# Patient Record
Sex: Female | Born: 1980 | Race: Black or African American | Hispanic: No | Marital: Single | Smoking: Never smoker
Health system: Southern US, Community
[De-identification: ages and names within clinical notes are randomized; demographics above are authoritative.]

## PROBLEM LIST (undated history)

## (undated) DIAGNOSIS — G43909 Migraine, unspecified, not intractable, without status migrainosus: Secondary | ICD-10-CM

## (undated) HISTORY — PX: TUBAL LIGATION: SHX77

---

## 2002-07-21 ENCOUNTER — Emergency Department (HOSPITAL_COMMUNITY): Admission: EM | Admit: 2002-07-21 | Discharge: 2002-07-21 | Payer: Self-pay | Admitting: Emergency Medicine

## 2002-08-09 ENCOUNTER — Other Ambulatory Visit: Admission: RE | Admit: 2002-08-09 | Discharge: 2002-08-09 | Payer: Self-pay | Admitting: Obstetrics and Gynecology

## 2003-05-23 ENCOUNTER — Other Ambulatory Visit: Admission: RE | Admit: 2003-05-23 | Discharge: 2003-05-23 | Payer: Self-pay | Admitting: Obstetrics and Gynecology

## 2003-12-04 ENCOUNTER — Inpatient Hospital Stay: Admission: AD | Admit: 2003-12-04 | Discharge: 2003-12-04 | Payer: Self-pay | Admitting: Obstetrics and Gynecology

## 2003-12-05 ENCOUNTER — Inpatient Hospital Stay (HOSPITAL_COMMUNITY): Admission: AD | Admit: 2003-12-05 | Discharge: 2003-12-07 | Payer: Self-pay | Admitting: Obstetrics and Gynecology

## 2003-12-05 ENCOUNTER — Encounter (INDEPENDENT_AMBULATORY_CARE_PROVIDER_SITE_OTHER): Payer: Self-pay | Admitting: Specialist

## 2006-05-20 ENCOUNTER — Emergency Department (HOSPITAL_COMMUNITY): Admission: EM | Admit: 2006-05-20 | Discharge: 2006-05-20 | Payer: Self-pay | Admitting: Family Medicine

## 2006-06-14 ENCOUNTER — Emergency Department (HOSPITAL_COMMUNITY): Admission: EM | Admit: 2006-06-14 | Discharge: 2006-06-14 | Payer: Self-pay | Admitting: Emergency Medicine

## 2006-07-06 ENCOUNTER — Emergency Department (HOSPITAL_COMMUNITY): Admission: EM | Admit: 2006-07-06 | Discharge: 2006-07-06 | Payer: Self-pay | Admitting: *Deleted

## 2006-07-28 ENCOUNTER — Ambulatory Visit (HOSPITAL_COMMUNITY): Admission: RE | Admit: 2006-07-28 | Discharge: 2006-07-28 | Payer: Self-pay | Admitting: Obstetrics & Gynecology

## 2006-08-10 ENCOUNTER — Inpatient Hospital Stay (HOSPITAL_COMMUNITY): Admission: AD | Admit: 2006-08-10 | Discharge: 2006-08-10 | Payer: Self-pay | Admitting: Obstetrics

## 2006-08-24 ENCOUNTER — Emergency Department (HOSPITAL_COMMUNITY): Admission: EM | Admit: 2006-08-24 | Discharge: 2006-08-24 | Payer: Self-pay | Admitting: Emergency Medicine

## 2006-10-12 ENCOUNTER — Ambulatory Visit (HOSPITAL_COMMUNITY): Admission: RE | Admit: 2006-10-12 | Discharge: 2006-10-12 | Payer: Self-pay | Admitting: Obstetrics & Gynecology

## 2006-10-27 ENCOUNTER — Inpatient Hospital Stay (HOSPITAL_COMMUNITY): Admission: AD | Admit: 2006-10-27 | Discharge: 2006-10-27 | Payer: Self-pay | Admitting: Obstetrics & Gynecology

## 2006-12-25 ENCOUNTER — Inpatient Hospital Stay (HOSPITAL_COMMUNITY): Admission: AD | Admit: 2006-12-25 | Discharge: 2006-12-25 | Payer: Self-pay | Admitting: Obstetrics

## 2007-02-06 ENCOUNTER — Inpatient Hospital Stay (HOSPITAL_COMMUNITY): Admission: AD | Admit: 2007-02-06 | Discharge: 2007-02-06 | Payer: Self-pay | Admitting: Obstetrics & Gynecology

## 2007-02-14 ENCOUNTER — Inpatient Hospital Stay (HOSPITAL_COMMUNITY): Admission: AD | Admit: 2007-02-14 | Discharge: 2007-02-14 | Payer: Self-pay | Admitting: Obstetrics & Gynecology

## 2007-02-14 ENCOUNTER — Inpatient Hospital Stay (HOSPITAL_COMMUNITY): Admission: AD | Admit: 2007-02-14 | Discharge: 2007-02-16 | Payer: Self-pay | Admitting: Obstetrics

## 2007-02-14 ENCOUNTER — Inpatient Hospital Stay (HOSPITAL_COMMUNITY): Admission: AD | Admit: 2007-02-14 | Discharge: 2007-02-14 | Payer: Self-pay | Admitting: Obstetrics

## 2007-04-20 ENCOUNTER — Ambulatory Visit (HOSPITAL_COMMUNITY): Admission: RE | Admit: 2007-04-20 | Discharge: 2007-04-20 | Payer: Self-pay | Admitting: Obstetrics & Gynecology
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
## 2008-07-09 ENCOUNTER — Emergency Department (HOSPITAL_COMMUNITY): Admission: EM | Admit: 2008-07-09 | Discharge: 2008-07-09 | Payer: Self-pay | Admitting: Emergency Medicine

## 2008-07-13 ENCOUNTER — Emergency Department (HOSPITAL_COMMUNITY): Admission: EM | Admit: 2008-07-13 | Discharge: 2008-07-13 | Payer: Self-pay | Admitting: Emergency Medicine

## 2009-03-25 ENCOUNTER — Emergency Department (HOSPITAL_COMMUNITY): Admission: EM | Admit: 2009-03-25 | Discharge: 2009-03-25 | Payer: Self-pay | Admitting: Family Medicine

## 2009-08-18 ENCOUNTER — Emergency Department (HOSPITAL_COMMUNITY): Admission: EM | Admit: 2009-08-18 | Discharge: 2009-08-18 | Payer: Self-pay | Admitting: Family Medicine

## 2010-10-31 ENCOUNTER — Encounter: Payer: Self-pay | Admitting: Obstetrics & Gynecology

## 2011-02-22 NOTE — H&P (Signed)
NAME:  POETRY, CERRO NO.:  [id number]   MEDICAL RECORD NO.:  [id number]          PATIENT TYPE:  AMB   LOCATION:                                FACILITY:  [facility]   PHYSICIAN:  Roseanna Rainbow, M.D.DATE OF BIRTH:  1981-01-18   DATE OF ADMISSION:  DATE OF DISCHARGE:                              HISTORY & PHYSICAL   CHIEF COMPLAINT:  The patient is a 30 year old who desires a  sterilization and presents for a laparoscopic bilateral tubal ligation.   HISTORY OF PRESENT ILLNESS:  Please see the above.   PAST GYNECOLOGICAL HISTORY:  Chlamydia.   PAST MEDICAL HISTORY:  She denies.   PAST SURGICAL HISTORY:  No previous surgery.   SOCIAL HISTORY:  She works at [employer], single.  She does  not give any significant history of alcohol use.  She has no significant  smoking history.  Denies illicit drug use.   FAMILY HISTORY:  No major illnesses noted.   ALLERGIES:  No known drug allergies.   MEDICATIONS:  Please see the medication reconciliation form.   PAST OBSTETRICAL HISTORY:  She is status post 2 NSVDs.   PHYSICAL EXAMINATION:  VITAL SIGNS:  Temperature 97.7, pulse 65, blood  pressure 112/77, weight 112 pounds.  GENERAL:  Well-developed, well-nourished, in no apparent distress.  LUNGS:  Clear to auscultation bilaterally.  HEART:  Regular rate and rhythm.  ABDOMEN:  No organomegaly.  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  On speculum exam, the vagina is  clean.  The cervix is without lesions.  Bimanual exam:  The uterus is  small, anteverted and nontender.  The adnexa are nontender and no  organomegaly appreciated.   ASSESSMENT:  Multipara, desires a sterilization procedure.   PLAN:  The planned procedure is a laparoscopic bilateral tubal ligation  with fulguration.  The risks, benefits and alternatives forms of  management were reviewed with the patient including, but not limited to,  4-8 per 1000 risk of failure with a subsequent  increased risk of an  ectopic pregnancy.      Roseanna Rainbow, M.D.  Electronically Signed     LAJ/MEDQ  D:  04/20/2007  T:  04/20/2007  Job:  [id number]

## 2011-02-22 NOTE — Op Note (Signed)
NAME:  Grace Woods, KRING NO.:  [id number]   MEDICAL RECORD NO.:  [id number]          PATIENT TYPE:  AMB   LOCATION:  [facility]                           FACILITY:  [facility]   PHYSICIAN:  Roseanna Rainbow, M.D.DATE OF BIRTH:  1981-01-10   DATE OF PROCEDURE:  04/20/2007  DATE OF DISCHARGE:                               OPERATIVE REPORT   PREOPERATIVE DIAGNOSIS:  Multiparity, desires sterilization   POSTOPERATIVE DIAGNOSIS:  Multiparity, desires sterilization   PROCEDURE:  Laparoscopic bilateral tubal ligation with fulguration.   SURGEON:  Roseanna Rainbow, M.D.   ANESTHESIA:  General endotracheal anesthesia.   COMPLICATIONS:  None.   ESTIMATED BLOOD LOSS:  Minimal.   FINDINGS:  Involving the ampullary portion of the right fallopian tube  was a filmy adhesion to the right ovary.  This was unavoidable torn  during attempts to elevate the tube and follow out the tube to its  fimbriated end.  A small bleeding site on the ovary was cauterized with  the bipolar cautery.  The site of the ovary was irrigated and felt to be  hemostatic.   PROCEDURE IN DETAIL:  The patient was taken to the operating room with  an IV running.  She was given general anesthesia and prepped and draped  in the usual sterile fashion.  An infraumbilical skin incision was then  made with the scalpel.  The Veress needle was then introduced into the  peritoneal cavity while tenting up the anterior abdominal wall at a 45  degrees angle.  Intra-abdominal placement was confirmed by saline drop  test and the appropriate low pressure reading upon insufflating the  abdomen with CO2 gas.  The abdomen was insufflated with 4 liters of CO2  gas.  The Veress needle was then removed.  The trocar and sleeve were  then advanced into the abdomen where intra-abdominal placement was  confirmed with the laparoscope.  Please see the above description with  the findings.  The mid isthmic portion of the right  fallopian tube was  then cauterized contiguously.  Cauterization was carried out using the  bipolar instrument.  With each application, the ohmmeter was noted to go  to 0.  The left fallopian tube was manipulated in a similar fashion.  All the instruments were then removed from the abdomen.  The skin  incision was then reapproximated with interrupted sutures of 3-0 Vicryl.  Please note that prior to the laparoscopy, a Hulka manipulator had been  advanced into the uterus and secured to the anterior lip of the  cervix as a means to manipulate the uterus.  This was removed at the end  of the procedure with minimal bleeding noted from the cervix.  At the  close of the procedure, the instrument and pack counts were said to be  correct x2.  The patient was taken to the PACU awake and in stable  condition.      Roseanna Rainbow, M.D.  Electronically Signed     LAJ/MEDQ  D:  04/20/2007  T:  04/21/2007  Job:  [id number]

## 2011-02-25 NOTE — Consult Note (Signed)
NAME:  Grace Woods, Grace Woods                        ACCOUNT NO.:  [id number]   MEDICAL RECORD NO.:  [id number]                   PATIENT TYPE:  MAT   LOCATION:  [facility]                                 FACILITY:  [facility]   PHYSICIAN:  Lenoard Aden, M.D.             DATE OF BIRTH:  26-Oct-1980   DATE OF CONSULTATION:  DATE OF DISCHARGE:                                   CONSULTATION   CHIEF COMPLAINT:  Rule out labor.   HISTORY OF PRESENT ILLNESS:  The patient is a 30 year old African-American  female, G1, P0, EDD December 16, 2003, at 38+ weeks, who presents with irregular  contractions and questionable bloody show.   The patient has no known drug allergies.   MEDICATIONS:  Prenatal vitamins, noncontributory.   PAST OBSTETRICAL HISTORY:  Noncontributory.   PAST MEDICAL HISTORY:  Noncontributory except for history of UTI x1.   FAMILY HISTORY:  Breast and prostate cancer.   PRENATAL LABORATORY DATA:  Blood type O positive.  Rh antibody negative.  Rubella immune.  Hepatitis, HIV negative.  GC, Chlamydia negative.  Hemoglobin electrophoresis within normal limits.   Pregnancy course is otherwise uncomplicated except for poor maternal weight  gain.   PHYSICAL EXAMINATION:  GENERAL:  She is a well-developed, well-nourished  African-American female in no acute distress, flat affect noted.  HEENT:  Normal.  CHEST:  Lungs clear.  CARDIAC:  Regular rhythm.  ABDOMEN:  Soft, gravid, and nontender.  PELVIC:  Cervix flared but closed, 2.5 cm long, vertex, and ballottable.  EXTREMITIES:  No cords.  NEUROLOGIC:  Nonfocal.   Fetal heart rate tracing in the 150 beat per minute range, now with  accelerations up to the 160s, of about 10-15 minutes within the tracing,  which reveals decreased beat-to-beat variability and subtle late  decelerations, which has now resolved.  Previous tracing over the last three  to four hours reveals otherwise reassuring tracing but not reactive by strip  criteria.   IMPRESSION:  1. Intrauterine pregnancy, 38+ weeks.  2. Prodromal labor pattern.  3. Questionable nonreassuring fetal heart rate status with reassuring     elements noted at this time, unfavorable cervix noted.   PLAN:  Obtain a BPP at this time.  Pending BPP and continuous monitoring,  will make a decision regarding expectant management versus induction and  possible delivery.                                               Lenoard Aden, M.D.    RJT/MEDQ  D:  12/04/2003  T:  12/04/2003  Job:  [id number]

## 2011-03-18 ENCOUNTER — Other Ambulatory Visit: Payer: Self-pay | Admitting: Family Medicine

## 2011-07-26 LAB — CBC
HCT: 37.8
Hemoglobin: 12.7
MCHC: 33.5
MCV: 84.4
RBC: 4.48
WBC: 3.2 — ABNORMAL LOW

## 2011-11-24 ENCOUNTER — Other Ambulatory Visit: Payer: Self-pay | Admitting: Family Medicine

## 2011-11-24 DIAGNOSIS — N644 Mastodynia: Secondary | ICD-10-CM

## 2011-12-02 ENCOUNTER — Other Ambulatory Visit: Payer: Self-pay

## 2011-12-05 ENCOUNTER — Ambulatory Visit
Admission: RE | Admit: 2011-12-05 | Discharge: 2011-12-05 | Disposition: A | Discharge status: Home / Self Care | Payer: Medicaid Other | Source: Ambulatory Visit | Attending: Family Medicine | Admitting: Family Medicine

## 2011-12-05 DIAGNOSIS — N644 Mastodynia: Secondary | ICD-10-CM

## 2012-12-12 ENCOUNTER — Encounter (HOSPITAL_COMMUNITY): Payer: Self-pay | Admitting: Emergency Medicine

## 2012-12-12 ENCOUNTER — Emergency Department (HOSPITAL_COMMUNITY)
Admission: EM | Admit: 2012-12-12 | Discharge: 2012-12-12 | Disposition: A | Discharge status: Home / Self Care | Payer: Medicaid Other | Source: Home / Self Care | Attending: Emergency Medicine | Admitting: Emergency Medicine

## 2012-12-12 DIAGNOSIS — R55 Syncope and collapse: Secondary | ICD-10-CM

## 2012-12-12 DIAGNOSIS — N390 Urinary tract infection, site not specified: Secondary | ICD-10-CM

## 2012-12-12 DIAGNOSIS — R110 Nausea: Secondary | ICD-10-CM

## 2012-12-12 DIAGNOSIS — R42 Dizziness and giddiness: Secondary | ICD-10-CM

## 2012-12-12 LAB — POCT I-STAT, CHEM 8
BUN: 7 mg/dL (ref 6–23)
Calcium, Ion: 1.24 mmol/L — ABNORMAL HIGH (ref 1.12–1.23)
Creatinine, Ser: 0.9 mg/dL (ref 0.50–1.10)
Glucose, Bld: 88 mg/dL (ref 70–99)
Sodium: 142 mEq/L (ref 135–145)
TCO2: 29 mmol/L (ref 0–100)

## 2012-12-12 LAB — POCT URINALYSIS DIP (DEVICE)
Hgb urine dipstick: NEGATIVE
Protein, ur: 30 mg/dL — AB
Specific Gravity, Urine: 1.025 (ref 1.005–1.030)
Urobilinogen, UA: 4 mg/dL — ABNORMAL HIGH (ref 0.0–1.0)
pH: 6.5 (ref 5.0–8.0)

## 2012-12-12 MED ORDER — CEPHALEXIN 500 MG PO CAPS: 500.0000 mg | ORAL_CAPSULE | Freq: Three times a day (TID) | ORAL | Status: DC

## 2012-12-12 NOTE — ED Notes (Signed)
Patient complains of chest pain and dizziness.

## 2012-12-12 NOTE — ED Provider Notes (Signed)
Chief Complaint  Patient presents with  . Chest Pain    History of Present Illness:   Grace Woods is a 32 year old female who passed out this past Sunday, 4 days ago while at church. Prior to the episode she felt sweaty, dizzy, nauseated. The patient states she will was unconscious for 20 minutes. There was no seizure activity and she denies any associated chest pain, tightness, pressure, shortness of breath, or palpitations either before, during, or after the event. EMS was called, and upon their arrival her blood pressure was a little bit low (she does not know the exact reading) but otherwise her vital signs were stable. She did not go to the hospital on that day. She went home and ever since then she's felt dizzy, nauseated, shaky, and weak. She vomited a couple times. She's felt hot, and had a headache, blurry vision, lower abdominal pain, but no diplopia, fever, chills, sore throat, coughing, wheezing, or shortness of breath. She denies any diarrhea or evidence of GI bleeding. She's had no urinary symptoms. Her last menstrual period December 15. She does not think she is pregnant. She's otherwise in good health. She takes no medications.  Review of Systems:  Other than noted above, the patient denies any of the following symptoms. Systemic:  No fever, chills, or fatigue. Pulmonary:  No cough, wheezing, shortness of breath. Cardiac:  No chest pain, tightness, pressure, palpitations, PND, orthopnea, or edema. Ext:  No leg pain or swelling. Neuro:  No weakness, paresthesias, or difficulty with speech or gait. No vertigo or difficujlty with coordination or balance. No seizure activity, tongue biting, or incontinence.  Psych:  No anxiety or depression.  PMFSH:  Past medical history, family history, social history, meds, and allergies were reviewed and updated as needed. No history of cardiac disease.  No history of excessive alcohol intake.  No family history of sudden death.  Physical Exam:    Vital signs:  BP 103/73  Pulse 87  Temp(Src) 98.3 F (36.8 C) (Oral)  SpO2 96% Filed Vitals:   12/12/12 1547 12/12/12 1615 Supine  12/12/12 1616 Sitting  12/12/12 1617 Standing   BP: 121/68 108/72 105/71 103/73  Pulse: 94 94 83 87  Temp: 98.3 F (36.8 C)     TempSrc: Oral     SpO2: 96%       Gen:  Alert, oriented, in no distress, skin warm and dry. Eye:  PERRL, lids and conjunctivas normal.  No stare or lid lag. ENT:  Mucous membranes moist, pharynx clear. Neck:  Supple, no adenopathy or tenderness.  No JVD.  Thyroid not enlarged. Lungs:  Clear to auscultation, no wheezes, rales or rhonchi.  No respiratory distress. Heart:  Regular rhythm, no extrasystoles.  No gallops, murmers, clicks or rubs. Abdomen:  Soft, nontender, no organomegaly or mass.  Bowel sounds normal.  No pulsatile abdominal mass or bruit. Ext:  No edema. Pulses full and equal. Neuro:  Neurological examination: The patient is alert and oriented x3. Speech is clear, fluent, and appropriate. Cranial nerves are intact. There is no pronator drift and finger to nose was normal. Muscle strength, sensation, and DTRs are normal. Babinskis are downgoing. Station and gait were normal. Romberg sign is negative, patient is able to perform tandem gait well. Skin:  Warm and dry.  No rash.  Labs:   Results for orders placed during the hospital encounter of 12/12/12  POCT URINALYSIS DIP (DEVICE)      Result Value Range   Glucose, UA NEGATIVE  NEGATIVE mg/dL   Bilirubin Urine NEGATIVE  NEGATIVE   Ketones, ur NEGATIVE  NEGATIVE mg/dL   Specific Gravity, Urine 1.025  1.005 - 1.030   Hgb urine dipstick NEGATIVE  NEGATIVE   pH 6.5  5.0 - 8.0   Protein, ur 30 (*) NEGATIVE mg/dL   Urobilinogen, UA 4.0 (*) 0.0 - 1.0 mg/dL   Nitrite POSITIVE (*) NEGATIVE   Leukocytes, UA SMALL (*) NEGATIVE  POCT PREGNANCY, URINE      Result Value Range   Preg Test, Ur NEGATIVE  NEGATIVE  POCT I-STAT, CHEM 8      Result Value Range   Sodium  142  135 - 145 mEq/L   Potassium 4.4  3.5 - 5.1 mEq/L   Chloride 104  96 - 112 mEq/L   BUN 7  6 - 23 mg/dL   Creatinine, Ser 1.61  0.50 - 1.10 mg/dL   Glucose, Bld 88  70 - 99 mg/dL   Calcium, Ion 0.96 (*) 1.12 - 1.23 mmol/L   TCO2 29  0 - 100 mmol/L   Hemoglobin 13.9  12.0 - 15.0 g/dL   HCT 04.5  40.9 - 81.1 %     EKG:   Date: 12/12/2012  Rate: 76  Rhythm: normal sinus rhythm  QRS Axis: normal  Intervals: PR shortened  ST/T Wave abnormalities: normal  Conduction Disutrbances:none  Narrative Interpretation: Sinus rhythm with short PR interval, otherwise normal EKG. The PR interval was 0.108.  Old EKG Reviewed: none available  Assessment:  The primary encounter diagnosis was Syncope. Diagnoses of Dizziness, Nausea, and UTI (lower urinary tract infection) were also pertinent to this visit.  She appears to have urinary tract infection, but no frequently and the episode of syncope on that. This may have just been a vasovagal episode, but she still having symptoms. I'm going to go ahead and get a urine culture and treat her UTI and have her followup with a cardiologist.  Plan:   1.  The following meds were prescribed:   Discharge Medication List as of 12/12/2012  4:39 PM    START taking these medications   Details  cephALEXin (KEFLEX) 500 MG capsule Take 1 capsule (500 mg total) by mouth 3 (three) times daily., Starting 12/12/2012, Until Discontinued, Normal       2.  The patient was instructed in symptomatic care and handouts were given. 3.  The patient was told to return if becoming worse in any way, if no better in 3 or 4 days, and given some red flag symptoms including syncope, presyncope, dyspnea, or chest pain that would indicate earlier return.  Follow up:  The patient was told to follow up with [facility] cardiology early next week.     Reuben Likes, MD 12/12/12 [phone]

## 2012-12-15 LAB — URINE CULTURE

## 2012-12-17 NOTE — ED Notes (Signed)
Urine culture: >100,000 colonies E. Coli. Pt. adequately treated with Keflex. Vassie Moselle 12/17/2012

## 2013-06-09 ENCOUNTER — Encounter (HOSPITAL_COMMUNITY): Payer: Self-pay | Admitting: Emergency Medicine

## 2013-06-09 ENCOUNTER — Emergency Department (HOSPITAL_COMMUNITY)
Admission: EM | Admit: 2013-06-09 | Discharge: 2013-06-09 | Disposition: A | Discharge status: Home / Self Care | Payer: Medicaid Other | Attending: Emergency Medicine | Admitting: Emergency Medicine

## 2013-06-09 DIAGNOSIS — R51 Headache: Secondary | ICD-10-CM | POA: Insufficient documentation

## 2013-06-09 HISTORY — DX: Migraine, unspecified, not intractable, without status migrainosus: G43.909

## 2013-06-09 MED ORDER — PROCHLORPERAZINE MALEATE 10 MG PO TABS: 10.0000 mg | ORAL_TABLET | Freq: Two times a day (BID) | ORAL | Status: DC | PRN

## 2013-06-09 MED ORDER — PROCHLORPERAZINE EDISYLATE 5 MG/ML IJ SOLN
10.0000 mg | Freq: Once | INTRAMUSCULAR | Status: AC
  Administered 2013-06-09: 10 mg via INTRAVENOUS
  Filled 2013-06-09: qty 2

## 2013-06-09 MED ORDER — SODIUM CHLORIDE 0.9 % IV BOLUS (SEPSIS)
1000.0000 mL | Freq: Once | INTRAVENOUS | Status: AC
  Administered 2013-06-09: 1000 mL via INTRAVENOUS

## 2013-06-09 NOTE — ED Notes (Signed)
Pt reports having an migraine for the past two months, which is typical for herself. Pt was seen at an urgent care and they provided 800 mg of ibuprofen, which has not relived symptoms. Pt denies nausea and sensitivity to sound, however reports photophobia. Pt is A/O x4 and is in NAD.

## 2013-06-09 NOTE — ED Provider Notes (Signed)
Medical screening examination/treatment/procedure(s) were performed by non-physician practitioner and as supervising physician I was immediately available for consultation/collaboration.  Idaly Verret L Sybol Morre, MD 06/09/13 2324 

## 2013-06-09 NOTE — ED Provider Notes (Signed)
CSN: [id number]     Arrival date & time 06/09/13  1516 History   First MD Initiated Contact with Patient 06/09/13 1603     Chief Complaint  Patient presents with  . Migraine   (Consider location/radiation/quality/duration/timing/severity/associated sxs/prior Treatment) HPI  AYO GUARINO is a 32 y.o. female complaining of exacerbation of typical HA. Pain is described as throbbing, frontal R>L associated with photophobia, described as throbbing rated at 7/10 Pt denies Neck pain, fever, phonophobia, dysarthria, ataxia.   Past Medical History  Diagnosis Date  . Migraine    Past Surgical History  Procedure Laterality Date  . Tubal ligation     No family history on file. History  Substance Use Topics  . Smoking status: Never Smoker   . Smokeless tobacco: Never Used  . Alcohol Use: No   OB History   Grav Para Term Preterm Abortions TAB SAB Ect Mult Living                 Review of Systems 10 systems reviewed and found to be negative, except as noted in the HPI  Allergies  Review of patient's allergies indicates no known allergies.  Home Medications   Current Outpatient Rx  Name  Route  Sig  Dispense  Refill  . ibuprofen (ADVIL,MOTRIN) 800 MG tablet   Oral   Take 800 mg by mouth every 8 (eight) hours as needed for pain.          BP 108/69  Pulse 77  Temp(Src) 99.1 F (37.3 C) (Oral)  Resp 18  SpO2 100%  LMP 05/26/2013 Physical Exam  Nursing note and vitals reviewed. Constitutional: She is oriented to person, place, and time. She appears well-developed and well-nourished. No distress.  HENT:  Head: Normocephalic.  Eyes: Conjunctivae and EOM are normal. Pupils are equal, round, and reactive to light.  Cardiovascular: Normal rate.   Pulmonary/Chest: Effort normal and breath sounds normal. No stridor. No respiratory distress. She has no wheezes. She has no rales. She exhibits no tenderness.  Musculoskeletal: Normal range of motion.  Neurological: She is alert and  oriented to person, place, and time.  Follows commands, Goal oriented speech, Strength is 5 out of 5x4 extremities, patient ambulates with a coordinated in nonantalgic gait. Sensation is grossly intact.   Psychiatric: She has a normal mood and affect.    ED Course  Procedures (including critical care time) Labs Review Labs Reviewed - No data to display Imaging Review No results found.  MDM   1. Headache    Filed Vitals:   06/09/13 1543  BP: 108/69  Pulse: 77  Temp: 99.1 F (37.3 C)  TempSrc: Oral  Resp: 18  SpO2: 100%     MAHLIA FERNANDO is a 32 y.o. female Pt HA treated and improved while in ED.  Presentation is like pts typical HA and non concerning for [facility], [facility], Meningitis, or temporal arteritis. Pt is afebrile with no focal neuro deficits, nuchal rigidity, or change in vision. Pt is to follow up with PCP to discuss neurology medication. Pt verbalizes understanding and is agreeable with plan to dc.   Medications  sodium chloride 0.9 % bolus 1,000 mL (0 mLs Intravenous Stopped 06/09/13 1832)  prochlorperazine (COMPAZINE) injection 10 mg (10 mg Intravenous Given 06/09/13 1700)   Pt is hemodynamically stable, appropriate for, and amenable to discharge at this time. Pt verbalized understanding and agrees with care plan. All questions answered. Outpatient follow-up and specific return precautions discussed.    New  Prescriptions   PROCHLORPERAZINE (COMPAZINE) 10 MG TABLET    Take 1 tablet (10 mg total) by mouth 2 (two) times daily as needed (Nausea ).   Note: Portions of this report may have been transcribed using voice recognition software. Every effort was made to ensure accuracy; however, inadvertent computerized transcription errors may be present      Wynetta Emery, PA-C 06/09/13 2004

## 2013-06-09 NOTE — ED Notes (Signed)
Family at bedside. 

## 2013-06-09 NOTE — ED Notes (Signed)
MD at bedside. 

## 2013-06-11 ENCOUNTER — Emergency Department (HOSPITAL_COMMUNITY)
Admission: EM | Admit: 2013-06-11 | Discharge: 2013-06-11 | Disposition: A | Discharge status: Home / Self Care | Payer: Medicaid Other | Attending: Emergency Medicine | Admitting: Emergency Medicine

## 2013-06-11 ENCOUNTER — Encounter (HOSPITAL_COMMUNITY): Payer: Self-pay | Admitting: *Deleted

## 2013-06-11 DIAGNOSIS — Z3202 Encounter for pregnancy test, result negative: Secondary | ICD-10-CM | POA: Insufficient documentation

## 2013-06-11 DIAGNOSIS — Z8679 Personal history of other diseases of the circulatory system: Secondary | ICD-10-CM | POA: Insufficient documentation

## 2013-06-11 DIAGNOSIS — R1011 Right upper quadrant pain: Secondary | ICD-10-CM | POA: Insufficient documentation

## 2013-06-11 DIAGNOSIS — Z9851 Tubal ligation status: Secondary | ICD-10-CM | POA: Insufficient documentation

## 2013-06-11 DIAGNOSIS — R1013 Epigastric pain: Secondary | ICD-10-CM | POA: Insufficient documentation

## 2013-06-11 DIAGNOSIS — R109 Unspecified abdominal pain: Secondary | ICD-10-CM

## 2013-06-11 DIAGNOSIS — R1033 Periumbilical pain: Secondary | ICD-10-CM | POA: Insufficient documentation

## 2013-06-11 LAB — CBC
MCH: 30.1 pg (ref 26.0–34.0)
MCHC: 34 g/dL (ref 30.0–36.0)
Platelets: 276 10*3/uL (ref 150–400)
RDW: 12.5 % (ref 11.5–15.5)

## 2013-06-11 LAB — URINALYSIS, ROUTINE W REFLEX MICROSCOPIC
Hgb urine dipstick: NEGATIVE
Specific Gravity, Urine: 1.026 (ref 1.005–1.030)
Urobilinogen, UA: 1 mg/dL (ref 0.0–1.0)
pH: 5.5 (ref 5.0–8.0)

## 2013-06-11 LAB — COMPREHENSIVE METABOLIC PANEL
ALT: 8 U/L (ref 0–35)
AST: 16 U/L (ref 0–37)
Albumin: 3.8 g/dL (ref 3.5–5.2)
Alkaline Phosphatase: 39 U/L (ref 39–117)
Calcium: 9.4 mg/dL (ref 8.4–10.5)
GFR calc Af Amer: >90 mL/min (ref >90)
Glucose, Bld: 108 mg/dL — ABNORMAL HIGH (ref 70–99)
Potassium: 3.8 mEq/L (ref 3.5–5.1)
Sodium: 141 mEq/L (ref 135–145)
Total Protein: 7.6 g/dL (ref 6.0–8.3)

## 2013-06-11 LAB — URINE MICROSCOPIC-ADD ON

## 2013-06-11 LAB — POCT PREGNANCY, URINE: Preg Test, Ur: NEGATIVE

## 2013-06-11 MED ORDER — KETOROLAC TROMETHAMINE 60 MG/2ML IM SOLN
30.0000 mg | Freq: Once | INTRAMUSCULAR | Status: AC
  Administered 2013-06-11: 30 mg via INTRAMUSCULAR
  Filled 2013-06-11: qty 2

## 2013-06-11 MED ORDER — ONDANSETRON 4 MG PO TBDP
4.0000 mg | ORAL_TABLET | Freq: Once | ORAL | Status: AC
  Administered 2013-06-11: 4 mg via ORAL
  Filled 2013-06-11: qty 1

## 2013-06-11 NOTE — ED Provider Notes (Signed)
CSN: [id number]     Arrival date & time 06/11/13  0607 History   First MD Initiated Contact with Patient 06/11/13 [phone]     Chief Complaint  Patient presents with  . Abdominal Pain   (Consider location/radiation/quality/duration/timing/severity/associated sxs/prior Treatment) HPI Comments: 32 year old female presents to the emergency department complaining of gradual onset lower abdominal pain x1 day. Patient states she was laying down yesterday morning when the pain began, described as sharp, constant, nonradiating rated 9/10. She tried taking Pepto-Bismol without relief. Admits to associated nausea without vomiting. She was able to eat chicken nuggets last night without any problem. The night before onset of symptoms she had chicken and rice with her boyfriend also had and he feels fine. Patient was in the emergency department 2 days ago and treated for migraine, believes that the IV placed in her arm is the cause of her abdominal pain. Denies any changes in urinary or bowel habits. Denies fever or chills. Last menstrual period was 2 weeks ago and normal. Denies vaginal bleeding, discharge or pain.  Patient is a 32 y.o. female presenting with abdominal pain. The history is provided by the patient and a significant other.  Abdominal Pain Associated symptoms: nausea   Associated symptoms: no chest pain, no chills, no constipation, no diarrhea, no dysuria, no fever, no shortness of breath, no vaginal bleeding, no vaginal discharge and no vomiting     Past Medical History  Diagnosis Date  . Migraine    Past Surgical History  Procedure Laterality Date  . Tubal ligation     No family history on file. History  Substance Use Topics  . Smoking status: Never Smoker   . Smokeless tobacco: Never Used  . Alcohol Use: No   OB History   Grav Para Term Preterm Abortions TAB SAB Ect Mult Living                 Review of Systems  Constitutional: Negative for fever and chills.  Respiratory:  Negative for shortness of breath.   Cardiovascular: Negative for chest pain.  Gastrointestinal: Positive for nausea and abdominal pain. Negative for vomiting, diarrhea and constipation.  Genitourinary: Negative for dysuria, vaginal bleeding, vaginal discharge, difficulty urinating, vaginal pain and menstrual problem.  Musculoskeletal: Negative for back pain.  All other systems reviewed and are negative.    Allergies  Review of patient's allergies indicates no known allergies.  Home Medications   Current Outpatient Rx  Name  Route  Sig  Dispense  Refill  . ibuprofen (ADVIL,MOTRIN) 800 MG tablet   Oral   Take 800 mg by mouth every 8 (eight) hours as needed for pain.         Marland Kitchen prochlorperazine (COMPAZINE) 10 MG tablet   Oral   Take 1 tablet (10 mg total) by mouth 2 (two) times daily as needed (Nausea ).   10 tablet   0    BP 104/64  Pulse 82  Temp(Src) 99 F (37.2 C) (Oral)  Resp 20  Ht 5\' 2"  (1.575 m)  Wt 122 lb (55.339 kg)  BMI 22.31 kg/m2  SpO2 96%  LMP 05/26/2013 Physical Exam  Nursing note and vitals reviewed. Constitutional: She is oriented to person, place, and time. She appears well-developed and well-nourished. No distress.  HENT:  Head: Normocephalic and atraumatic.  Mouth/Throat: Oropharynx is clear and moist.  Eyes: Conjunctivae are normal. No scleral icterus.  Neck: Normal range of motion. Neck supple.  Cardiovascular: Normal rate, regular rhythm and normal heart sounds.  Pulmonary/Chest: Effort normal and breath sounds normal.  Abdominal: Soft. Bowel sounds are normal. There is tenderness (deep palpation only) in the right upper quadrant, epigastric area and periumbilical area. There is no rigidity, no rebound, no guarding and no CVA tenderness.  No peritoneal signs.  Musculoskeletal: Normal range of motion. She exhibits no edema.  Neurological: She is alert and oriented to person, place, and time.  Skin: Skin is warm and dry. She is not diaphoretic.   Psychiatric: She has a normal mood and affect. Her behavior is normal.    ED Course  Procedures (including critical care time) Labs Review Labs Reviewed  CBC - Abnormal; Notable for the following:    WBC 3.9 (*)    All other components within normal limits  COMPREHENSIVE METABOLIC PANEL - Abnormal; Notable for the following:    Glucose, Bld 108 (*)    All other components within normal limits  URINALYSIS, ROUTINE W REFLEX MICROSCOPIC - Abnormal; Notable for the following:    APPearance CLOUDY (*)    Leukocytes, UA SMALL (*)    All other components within normal limits  URINE MICROSCOPIC-ADD ON - Abnormal; Notable for the following:    Squamous Epithelial / LPF MANY (*)    Bacteria, UA FEW (*)    All other components within normal limits  URINE CULTURE  LIPASE, BLOOD  POCT PREGNANCY, URINE   Imaging Review No results found.  MDM   1. Abdominal pain    Patient with abdominal pain, nausea. Abdomen soft, no peritoneal signs. She appears in NAD, normal vital signs. Labs unremarkable, no UTI. Pain improved after receiving toradol, nausea subsided with zofran. No tenderness in lower abdomen despite patient stating symptoms in that area. Diagnosis most likely gastroenteritis. Doubt appendicitis, gallbladder pathology, ovarian or pelvic pathology. She is stable for discharge. Conservative measures discussed. Return precautions discussed. Patient states understanding of treatment care plan and is agreeable.   Trevor Mace, PA-C 06/11/13 [phone]

## 2013-06-11 NOTE — ED Notes (Signed)
Pt c/o abd pain; denies n/v/d

## 2013-06-11 NOTE — ED Provider Notes (Signed)
Medical screening examination/treatment/procedure(s) were performed by non-physician practitioner and as supervising physician I was immediately available for consultation/collaboration.  Shon Baton, MD 06/11/13 1440

## 2013-06-13 LAB — URINE CULTURE

## 2013-06-14 NOTE — Progress Notes (Signed)
Post ED Visit - Positive Culture Follow-up  Culture report reviewed by antimicrobial stewardship pharmacist: []  Wes Dulaney, Pharm.D., BCPS []  Celedonio Miyamoto, [address].D., BCPS []  Georgina Pillion, [address].D., BCPS []  [place], [address].D., BCPS, AAHIVP [x]  Estella Husk, Pharm.D., BCPS, AAHIVP  Positive Urine culture but no UTI per ED provider  Estella Husk, Pharm.D., BCPS, AAHIVP Clinical Pharmacist Phone: [phone] or [phone] Pager: [phone] 06/14/2013, 11:35 AM

## 2014-09-05 ENCOUNTER — Emergency Department (HOSPITAL_COMMUNITY)
Admission: EM | Admit: 2014-09-05 | Discharge: 2014-09-05 | Disposition: A | Discharge status: Home / Self Care | Payer: Medicaid Other | Attending: Emergency Medicine | Admitting: Emergency Medicine

## 2014-09-05 ENCOUNTER — Encounter (HOSPITAL_COMMUNITY): Payer: Self-pay

## 2014-09-05 DIAGNOSIS — Z8679 Personal history of other diseases of the circulatory system: Secondary | ICD-10-CM | POA: Diagnosis not present

## 2014-09-05 DIAGNOSIS — R519 Headache, unspecified: Secondary | ICD-10-CM

## 2014-09-05 DIAGNOSIS — R51 Headache: Secondary | ICD-10-CM | POA: Insufficient documentation

## 2014-09-05 MED ORDER — TRAMADOL HCL 50 MG PO TABS: 50.0000 mg | ORAL_TABLET | Freq: Four times a day (QID) | ORAL | Status: DC | PRN

## 2014-09-05 NOTE — Discharge Instructions (Signed)
Headaches, Frequently Asked Questions °MIGRAINE HEADACHES °Q: What is migraine? What causes it? How can I treat it? °A: Generally, migraine headaches begin as a dull ache. Then they develop into a constant, throbbing, and pulsating pain. You may experience pain at the temples. You may experience pain at the front or back of one or both sides of the head. The pain is usually accompanied by a combination of: °· Nausea. °· Vomiting. °· Sensitivity to light and noise. °Some people (about 15%) experience an aura (see below) before an attack. The cause of migraine is believed to be chemical reactions in the brain. Treatment for migraine may include over-the-counter or prescription medications. It may also include self-help techniques. These include relaxation training and biofeedback.  °Q: What is an aura? °A: About 15% of people with migraine get an "aura". This is a sign of neurological symptoms that occur before a migraine headache. You may see wavy or jagged lines, dots, or flashing lights. You might experience tunnel vision or blind spots in one or both eyes. The aura can include visual or auditory hallucinations (something imagined). It may include disruptions in smell (such as strange odors), taste or touch. Other symptoms include: °· Numbness. °· A "pins and needles" sensation. °· Difficulty in recalling or speaking the correct word. °These neurological events may last as long as 60 minutes. These symptoms will fade as the headache begins. °Q: What is a trigger? °A: Certain physical or environmental factors can lead to or "trigger" a migraine. These include: °· Foods. °· Hormonal changes. °· Weather. °· Stress. °It is important to remember that triggers are different for everyone. To help prevent migraine attacks, you need to figure out which triggers affect you. Keep a headache diary. This is a good way to track triggers. The diary will help you talk to your healthcare professional about your condition. °Q: Does  weather affect migraines? °A: Bright sunshine, hot, humid conditions, and drastic changes in barometric pressure may lead to, or "trigger," a migraine attack in some people. But studies have shown that weather does not act as a trigger for everyone with migraines. °Q: What is the link between migraine and hormones? °A: Hormones start and regulate many of your body's functions. Hormones keep your body in balance within a constantly changing environment. The levels of hormones in your body are unbalanced at times. Examples are during menstruation, pregnancy, or menopause. That can lead to a migraine attack. In fact, about three quarters of all women with migraine report that their attacks are related to the menstrual cycle.  °Q: Is there an increased risk of stroke for migraine sufferers? °A: The likelihood of a migraine attack causing a stroke is very remote. That is not to say that migraine sufferers cannot have a stroke associated with their migraines. In persons under age 40, the most common associated factor for stroke is migraine headache. But over the course of a person's normal life span, the occurrence of migraine headache may actually be associated with a reduced risk of dying from cerebrovascular disease due to stroke.  °Q: What are acute medications for migraine? °A: Acute medications are used to treat the pain of the headache after it has started. Examples over-the-counter medications, NSAIDs, ergots, and triptans.  °Q: What are the triptans? °A: Triptans are the newest class of abortive medications. They are specifically targeted to treat migraine. Triptans are vasoconstrictors. They moderate some chemical reactions in the brain. The triptans work on receptors in your brain. Triptans help   to restore the balance of a neurotransmitter called serotonin. Fluctuations in levels of serotonin are thought to be a main cause of migraine.  °Q: Are over-the-counter medications for migraine effective? °A:  Over-the-counter, or "OTC," medications may be effective in relieving mild to moderate pain and associated symptoms of migraine. But you should see your caregiver before beginning any treatment regimen for migraine.  °Q: What are preventive medications for migraine? °A: Preventive medications for migraine are sometimes referred to as "prophylactic" treatments. They are used to reduce the frequency, severity, and length of migraine attacks. Examples of preventive medications include antiepileptic medications, antidepressants, beta-blockers, calcium channel blockers, and NSAIDs (nonsteroidal anti-inflammatory drugs). °Q: Why are anticonvulsants used to treat migraine? °A: During the past few years, there has been an increased interest in antiepileptic drugs for the prevention of migraine. They are sometimes referred to as "anticonvulsants". Both epilepsy and migraine may be caused by similar reactions in the brain.  °Q: Why are antidepressants used to treat migraine? °A: Antidepressants are typically used to treat people with depression. They may reduce migraine frequency by regulating chemical levels, such as serotonin, in the brain.  °Q: What alternative therapies are used to treat migraine? °A: The term "alternative therapies" is often used to describe treatments considered outside the scope of conventional Western medicine. Examples of alternative therapy include acupuncture, acupressure, and yoga. Another common alternative treatment is herbal therapy. Some herbs are believed to relieve headache pain. Always discuss alternative therapies with your caregiver before proceeding. Some herbal products contain arsenic and other toxins. °TENSION HEADACHES °Q: What is a tension-type headache? What causes it? How can I treat it? °A: Tension-type headaches occur randomly. They are often the result of temporary stress, anxiety, fatigue, or anger. Symptoms include soreness in your temples, a tightening band-like sensation  around your head (a "vice-like" ache). Symptoms can also include a pulling feeling, pressure sensations, and contracting head and neck muscles. The headache begins in your forehead, temples, or the back of your head and neck. Treatment for tension-type headache may include over-the-counter or prescription medications. Treatment may also include self-help techniques such as relaxation training and biofeedback. °CLUSTER HEADACHES °Q: What is a cluster headache? What causes it? How can I treat it? °A: Cluster headache gets its name because the attacks come in groups. The pain arrives with little, if any, warning. It is usually on one side of the head. A tearing or bloodshot eye and a runny nose on the same side of the headache may also accompany the pain. Cluster headaches are believed to be caused by chemical reactions in the brain. They have been described as the most severe and intense of any headache type. Treatment for cluster headache includes prescription medication and oxygen. °SINUS HEADACHES °Q: What is a sinus headache? What causes it? How can I treat it? °A: When a cavity in the bones of the face and skull (a sinus) becomes inflamed, the inflammation will cause localized pain. This condition is usually the result of an allergic reaction, a tumor, or an infection. If your headache is caused by a sinus blockage, such as an infection, you will probably have a fever. An x-ray will confirm a sinus blockage. Your caregiver's treatment might include antibiotics for the infection, as well as antihistamines or decongestants.  °REBOUND HEADACHES °Q: What is a rebound headache? What causes it? How can I treat it? °A: A pattern of taking acute headache medications too often can lead to a condition known as "rebound headache."   A pattern of taking too much headache medication includes taking it more than 2 days per week or in excessive amounts. That means more than the label or a caregiver advises. With rebound  headaches, your medications not only stop relieving pain, they actually begin to cause headaches. Doctors treat rebound headache by tapering the medication that is being overused. Sometimes your caregiver will gradually substitute a different type of treatment or medication. Stopping may be a challenge. Regularly overusing a medication increases the potential for serious side effects. Consult a caregiver if you regularly use headache medications more than 2 days per week or more than the label advises. °ADDITIONAL QUESTIONS AND ANSWERS °Q: What is biofeedback? °A: Biofeedback is a self-help treatment. Biofeedback uses special equipment to monitor your body's involuntary physical responses. Biofeedback monitors: °· Breathing. °· Pulse. °· Heart rate. °· Temperature. °· Muscle tension. °· Brain activity. °Biofeedback helps you refine and perfect your relaxation exercises. You learn to control the physical responses that are related to stress. Once the technique has been mastered, you do not need the equipment any more. °Q: Are headaches hereditary? °A: Four out of five (80%) of people that suffer report a family history of migraine. Scientists are not sure if this is genetic or a family predisposition. Despite the uncertainty, a child has a 50% chance of having migraine if one parent suffers. The child has a 75% chance if both parents suffer.  °Q: Can children get headaches? °A: By the time they reach high school, most young people have experienced some type of headache. Many safe and effective approaches or medications can prevent a headache from occurring or stop it after it has begun.  °Q: What type of doctor should I see to diagnose and treat my headache? °A: Start with your primary caregiver. Discuss his or her experience and approach to headaches. Discuss methods of classification, diagnosis, and treatment. Your caregiver may decide to recommend you to a headache specialist, depending upon your symptoms or other  physical conditions. Having diabetes, allergies, etc., may require a more comprehensive and inclusive approach to your headache. The [employer] will provide, upon request, a list of NHF physician members in your state. °Document Released: 12/17/2003 Document Revised: 12/19/2011 Document Reviewed: 05/26/2008 °ExitCare® Patient Information ©2015 [employer]. This information is not intended to replace advice given to you by your health care provider. Make sure you discuss any questions you have with your health care provider. ° °General Headache Without Cause °A general headache is pain or discomfort felt around the head or neck area. The cause may not be found.  °HOME CARE  °· Keep all doctor visits. °· Only take medicines as told by your doctor. °· Lie down in a dark, quiet room when you have a headache. °· Keep a journal to find out if certain things bring on headaches. For example, write down: °¨ What you eat and drink. °¨ How much sleep you get. °¨ Any change to your diet or medicines. °· Relax by getting a massage or doing other relaxing activities. °· Put ice or heat packs on the head and neck area as told by your doctor. °· Lessen stress. °· Sit up straight. Do not tighten (tense) your muscles. °· Quit smoking if you smoke. °· Lessen how much alcohol you drink. °· Lessen how much caffeine you drink, or stop drinking caffeine. °· Eat and sleep on a regular schedule. °· Get 7 to 9 hours of sleep, or as told by your   doctor. °· Keep lights dim if bright lights bother you or make your headaches worse. °GET HELP RIGHT AWAY IF:  °· Your headache becomes really bad. °· You have a fever. °· You have a stiff neck. °· You have trouble seeing. °· Your muscles are weak, or you lose muscle control. °· You lose your balance or have trouble walking. °· You feel like you will pass out (faint), or you pass out. °· You have really bad symptoms that are different than your first symptoms. °· You have problems  with the medicines given to you by your doctor. °· Your medicines do not work. °· Your headache feels different than the other headaches. °· You feel sick to your stomach (nauseous) or throw up (vomit). °MAKE SURE YOU:  °· Understand these instructions. °· Will watch your condition. °· Will get help right away if you are not doing well or get worse. °Document Released: 07/05/2008 Document Revised: 12/19/2011 Document Reviewed: 09/16/2011 °ExitCare® Patient Information ©2015 [employer]. This information is not intended to replace advice given to you by your health care provider. Make sure you discuss any questions you have with your health care provider. ° °

## 2014-09-05 NOTE — ED Notes (Signed)
Per pt, headache x 2 months.  On/off.  Headache now.  Not associated with menstrual

## 2014-09-10 NOTE — ED Provider Notes (Signed)
CSN: [id number]     Arrival date & time 09/05/14  1249 History   First MD Initiated Contact with Patient 09/05/14 1503     Chief Complaint  Patient presents with  . Headache     (Consider location/radiation/quality/duration/timing/severity/associated sxs/prior Treatment) HPI   33 year old female with headache. Intermittent for the past 2 months. Denies trauma. Patient has a past history what she calls migraines. States the current symptoms feel similar but they do not typically go on for this long. No acute visual complaints. No nausea vomiting. No acute no Scotty loss of strength. No dizziness or lightheadedness. No fever. No neck pain or stiffness. Has been taking NSAIDs with minimal relief.  Past Medical History  Diagnosis Date  . Migraine    Past Surgical History  Procedure Laterality Date  . Tubal ligation     History reviewed. No pertinent family history. History  Substance Use Topics  . Smoking status: Never Smoker   . Smokeless tobacco: Never Used  . Alcohol Use: No   OB History    No data available     Review of Systems  All systems reviewed and negative, other than as noted in HPI.   Allergies  Review of patient's allergies indicates no known allergies.  Home Medications   Prior to Admission medications   Medication Sig Start Date End Date Taking? Authorizing Provider  prochlorperazine (COMPAZINE) 10 MG tablet Take 1 tablet (10 mg total) by mouth 2 (two) times daily as needed (Nausea ). Patient not taking: Reported on 09/05/2014 06/09/13   Joni ReiningNicole Pisciotta, PA-C  traMADol (ULTRAM) 50 MG tablet Take 1 tablet (50 mg total) by mouth every 6 (six) hours as needed. 09/05/14   Raeford RazorStephen Anea Fodera, MD   BP 109/72 mmHg  Pulse 76  Temp(Src) 97.4 F (36.3 C) (Oral)  Resp 16  SpO2 99%  LMP 08/11/2014 Physical Exam  Constitutional: She is oriented to person, place, and time. She appears well-developed and well-nourished. No distress.  HENT:  Head: Normocephalic and  atraumatic.  Eyes: Conjunctivae and EOM are normal. Pupils are equal, round, and reactive to light. Right eye exhibits no discharge. Left eye exhibits no discharge.  Neck: Neck supple.  No nuchal rigidity  Cardiovascular: Normal rate, regular rhythm and normal heart sounds.  Exam reveals no gallop and no friction rub.   No murmur heard. Pulmonary/Chest: Effort normal and breath sounds normal. No respiratory distress.  Abdominal: Soft. She exhibits no distension. There is no tenderness.  Musculoskeletal: She exhibits no edema or tenderness.  Neurological: She is alert and oriented to person, place, and time. No cranial nerve deficit. She exhibits normal muscle tone. Coordination normal.  Speech clear. Content appropriate. Gait is steady. Good finger nose testing bilaterally.  Skin: Skin is warm and dry.  Psychiatric: She has a normal mood and affect. Her behavior is normal. Thought content normal.  Nursing note and vitals reviewed.   ED Course  Procedures (including critical care time) Labs Review Labs Reviewed - No data to display  Imaging Review No results found.   EKG Interpretation None      MDM   Final diagnoses:  Nonintractable headache, unspecified chronicity pattern, unspecified headache type    33yF with headache. Suspect primary HA. Consider emergent secondary causes such as bleed, infectious or mass but doubt. There is no history of trauma. Pt has a nonfocal neurological exam. Afebrile and neck supple. No use of blood thinning medication. Consider ocular etiology such as acute angle closure glaucoma but doubt.  Pt denies acute change in visual acuity and eye exam unremarkable. Doubt temporal arteritis given age, no temporal tenderness and temporal artery pulsations palpable. Doubt CO poisoning. No contacts with similar symptoms. Doubt venous thrombosis. Doubt carotid or vertebral arteries dissection. Symptoms improved with meds. Feel that can be safely discharged, but  strict return precautions discussed. Outpt fu.     Raeford RazorStephen Rima Blizzard, MD 09/10/14 2113

## 2014-09-26 DIAGNOSIS — Z800 Family history of malignant neoplasm of digestive organs: Secondary | ICD-10-CM | POA: Insufficient documentation

## 2015-06-14 ENCOUNTER — Emergency Department (HOSPITAL_COMMUNITY)
Admission: EM | Admit: 2015-06-14 | Discharge: 2015-06-14 | Disposition: A | Discharge status: Home / Self Care | Payer: Medicaid Other | Attending: Emergency Medicine | Admitting: Emergency Medicine

## 2015-06-14 ENCOUNTER — Encounter (HOSPITAL_COMMUNITY): Payer: Self-pay

## 2015-06-14 DIAGNOSIS — Z8679 Personal history of other diseases of the circulatory system: Secondary | ICD-10-CM | POA: Insufficient documentation

## 2015-06-14 DIAGNOSIS — R51 Headache: Secondary | ICD-10-CM | POA: Insufficient documentation

## 2015-06-14 DIAGNOSIS — G8929 Other chronic pain: Secondary | ICD-10-CM | POA: Insufficient documentation

## 2015-06-14 NOTE — Discharge Instructions (Signed)

## 2015-06-14 NOTE — ED Provider Notes (Signed)
CSN: [id number]     Arrival date & time 06/14/15  0721 History   First MD Initiated Contact with Patient 06/14/15 0732     Chief Complaint  Patient presents with  . Headache     (Consider location/radiation/quality/duration/timing/severity/associated sxs/prior Treatment) Patient is a 34 y.o. female presenting with headaches.  Headache Pain location:  Generalized Quality:  Dull Radiates to:  Does not radiate Onset quality:  Gradual Duration: 2 years. Timing:  Constant Progression:  Unchanged Chronicity:  Chronic Similar to prior headaches: yes   Context comment:  Husband recently dx with brain tumor, pt is concerned about same Relieved by:  Nothing Worsened by:  Light, sound and activity Associated symptoms: no abdominal pain, no diarrhea, no fever, no nausea, no neck pain, no neck stiffness and no vomiting     Past Medical History  Diagnosis Date  . Migraine    Past Surgical History  Procedure Laterality Date  . Tubal ligation     No family history on file. Social History  Substance Use Topics  . Smoking status: Never Smoker   . Smokeless tobacco: Never Used  . Alcohol Use: No   OB History    No data available     Review of Systems  Constitutional: Negative for fever.  Gastrointestinal: Negative for nausea, vomiting, abdominal pain and diarrhea.  Musculoskeletal: Negative for neck pain and neck stiffness.  Neurological: Positive for headaches.  All other systems reviewed and are negative.     Allergies  Review of patient's allergies indicates no known allergies.  Home Medications   Prior to Admission medications   Medication Sig Start Date End Date Taking? Authorizing Provider  prochlorperazine (COMPAZINE) 10 MG tablet Take 1 tablet (10 mg total) by mouth 2 (two) times daily as needed (Nausea ). Patient not taking: Reported on 09/05/2014 06/09/13   Joni Reining Pisciotta, PA-C  traMADol (ULTRAM) 50 MG tablet Take 1 tablet (50 mg total) by mouth every 6 (six)  hours as needed. 09/05/14   Raeford Razor, MD   BP 119/78 mmHg  Pulse 101  Temp(Src) 98.3 F (36.8 C) (Oral)  Resp 16  SpO2 100%  LMP 05/27/2015 Physical Exam  Constitutional: She is oriented to person, place, and time. She appears well-developed and well-nourished.  HENT:  Head: Normocephalic and atraumatic.  Right Ear: External ear normal.  Left Ear: External ear normal.  Eyes: Conjunctivae and EOM are normal. Pupils are equal, round, and reactive to light.  Neck: Normal range of motion. Neck supple.  Cardiovascular: Normal rate, regular rhythm, normal heart sounds and intact distal pulses.   Pulmonary/Chest: Effort normal and breath sounds normal.  Abdominal: Soft. Bowel sounds are normal. There is no tenderness.  Musculoskeletal: Normal range of motion.  Neurological: She is alert and oriented to person, place, and time. She has normal strength and normal reflexes. No cranial nerve deficit or sensory deficit. Coordination and gait normal. GCS eye subscore is 4. GCS verbal subscore is 5. GCS motor subscore is 6.  Skin: Skin is warm and dry.  Vitals reviewed.   ED Course  Procedures (including critical care time) Labs Review Labs Reviewed - No data to display  Imaging Review No results found. I have personally reviewed and evaluated these images and lab results as part of my medical decision-making.   EKG Interpretation None      MDM   Final diagnoses:  Chronic nonintractable headache, unspecified headache type    34 y.o. female with pertinent PMH of chronic ha presents  with her chronic ha.  Patient states that her primary precipitant for the visit today is that her husband was diagnosed with a brain tumor and she is concerned that she might also have a brain tumor.  She denies any change in her symptoms over the last 2 years. No concerning historical elements, no awakening from sleep, no neurologic findings otherwise. On arrival today vitals signs and physical exam as  above. No focal neurodeficits on my exam.  No signs of meningismus. Discussed broad differential of chronic headaches with the patient including very slight chance of small brain tumor, carbon monoxide, glaucoma, subarachnoid hemorrhage. However, given the patient's benign exam and history doubt all of these emergent pathologies. We'll have the patient follow-up with neurology and she was given strict return precautions for the same..    I have reviewed all laboratory and imaging studies if ordered as above  1. Chronic nonintractable headache, unspecified headache type         Mirian Mo, MD 06/14/15 [phone]

## 2015-06-14 NOTE — ED Notes (Signed)
She states she has had issues with recurrent headaches "for years". Today she c/o frontal h/a and denies fever/n/v/d and is in no distress.

## 2015-06-14 NOTE — ED Notes (Signed)
Pt given d/c instructions, verbalized understanding. 

## 2015-12-24 ENCOUNTER — Encounter (HOSPITAL_COMMUNITY): Payer: Self-pay | Admitting: *Deleted

## 2015-12-24 ENCOUNTER — Emergency Department (HOSPITAL_COMMUNITY)
Admission: EM | Admit: 2015-12-24 | Discharge: 2015-12-24 | Disposition: A | Discharge status: Home / Self Care | Payer: Medicaid Other | Attending: Emergency Medicine | Admitting: Emergency Medicine

## 2015-12-24 DIAGNOSIS — Y92410 Unspecified street and highway as the place of occurrence of the external cause: Secondary | ICD-10-CM | POA: Insufficient documentation

## 2015-12-24 DIAGNOSIS — S0990XA Unspecified injury of head, initial encounter: Secondary | ICD-10-CM | POA: Insufficient documentation

## 2015-12-24 DIAGNOSIS — S3992XA Unspecified injury of lower back, initial encounter: Secondary | ICD-10-CM | POA: Insufficient documentation

## 2015-12-24 DIAGNOSIS — Z8679 Personal history of other diseases of the circulatory system: Secondary | ICD-10-CM | POA: Insufficient documentation

## 2015-12-24 DIAGNOSIS — Y998 Other external cause status: Secondary | ICD-10-CM | POA: Insufficient documentation

## 2015-12-24 DIAGNOSIS — Y9389 Activity, other specified: Secondary | ICD-10-CM | POA: Insufficient documentation

## 2015-12-24 MED ORDER — IBUPROFEN 400 MG PO TABS: 400.0000 mg | ORAL_TABLET | Freq: Four times a day (QID) | ORAL | Status: DC | PRN

## 2015-12-24 MED ORDER — METHOCARBAMOL 500 MG PO TABS: 500.0000 mg | ORAL_TABLET | Freq: Two times a day (BID) | ORAL | Status: DC

## 2015-12-24 NOTE — Discharge Instructions (Signed)
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
## 2016-04-04 ENCOUNTER — Ambulatory Visit (INDEPENDENT_AMBULATORY_CARE_PROVIDER_SITE_OTHER): Payer: BLUE CROSS/BLUE SHIELD | Admitting: Physician Assistant

## 2016-04-04 VITALS — BP 96/64 | HR 83 | Temp 98.7°F | Resp 16 | Ht 62.0 in | Wt 141.8 lb

## 2016-04-04 DIAGNOSIS — Z113 Encounter for screening for infections with a predominantly sexual mode of transmission: Secondary | ICD-10-CM | POA: Diagnosis not present

## 2016-04-04 DIAGNOSIS — Z0000 Encounter for general adult medical examination without abnormal findings: Secondary | ICD-10-CM | POA: Diagnosis not present

## 2016-04-04 DIAGNOSIS — R8290 Unspecified abnormal findings in urine: Secondary | ICD-10-CM

## 2016-04-04 DIAGNOSIS — Z1389 Encounter for screening for other disorder: Secondary | ICD-10-CM

## 2016-04-04 DIAGNOSIS — Z124 Encounter for screening for malignant neoplasm of cervix: Secondary | ICD-10-CM | POA: Diagnosis not present

## 2016-04-04 DIAGNOSIS — N3000 Acute cystitis without hematuria: Secondary | ICD-10-CM | POA: Diagnosis not present

## 2016-04-04 DIAGNOSIS — Z139 Encounter for screening, unspecified: Secondary | ICD-10-CM | POA: Diagnosis not present

## 2016-04-04 LAB — POCT URINALYSIS DIP (MANUAL ENTRY)
Bilirubin, UA: NEGATIVE
Blood, UA: NEGATIVE
Glucose, UA: NEGATIVE
NITRITE UA: POSITIVE — AB
Spec Grav, UA: 1.025
UROBILINOGEN UA: 1
pH, UA: 7

## 2016-04-04 MED ORDER — SULFAMETHOXAZOLE-TRIMETHOPRIM 800-160 MG PO TABS: 1.0000 | ORAL_TABLET | Freq: Two times a day (BID) | ORAL | Status: DC

## 2016-04-04 NOTE — Patient Instructions (Addendum)
IF you received an x-ray today, you will receive an invoice from [facility]. Please contact [facility] at [phone] with questions or concerns regarding your invoice.   IF you received labwork today, you will receive an invoice from [facility]. Please contact [facility] at [phone] with questions or concerns regarding your invoice.   Our billing staff will not be able to assist you with questions regarding bills from these companies.  You will be contacted with the lab results as soon as they are available. The fastest way to get your results is to activate your My Chart account. Instructions are located on the last page of this paperwork. If you have not heard from us regarding the results in 2 weeks, please contact this office.     Please make sure that you are hydrating well with water.  At least 64 oz of water per day.  This is almost 4 regular sized water bottles.   Please make sure that you are eating vegetables daily. I will have your lab results within the next 7-10 days.  Asymptomatic Bacteriuria, Female Asymptomatic bacteriuria is the presence of a large number of bacteria in your urine without the usual symptoms of burning or frequent urination. The following conditions increase the risk of asymptomatic bacteriuria:  Diabetes mellitus.  Advanced age.  Pregnancy in the first trimester.  Kidney stones.  Kidney transplants.  Leaky kidney tube valve in young children (reflux). Treatment for this condition is not needed in most people and can lead to other problems such as too much yeast and growth of resistant bacteria. However, some people, such as pregnant women, do need treatment to prevent kidney infection. Asymptomatic bacteriuria in pregnancy is also associated with fetal growth restriction, premature labor, and newborn death. HOME CARE INSTRUCTIONS Monitor your condition for any changes. The following actions may  help to relieve any discomfort you are feeling:  Drink enough water and fluids to keep your urine clear or pale yellow. Go to the bathroom more often to keep your bladder empty.  Keep the area around your vagina and rectum clean. Wipe yourself from front to back after urinating. SEEK IMMEDIATE MEDICAL CARE IF:  You develop signs of an infection such as:  Burning with urination.  Frequency of voiding.  Back pain.  Fever.  You have blood in the urine.  You develop a fever. MAKE SURE YOU:  Understand these instructions.  Will watch your condition.  Will get help right away if you are not doing well or get worse.   This information is not intended to replace advice given to you by your health care provider. Make sure you discuss any questions you have with your health care provider.   Document Released: 09/26/2005 Document Revised: 10/17/2014 Document Reviewed: 03/18/2013 Elsevier Interactive Patient Education 2016 [employer]   Keeping You Healthy  Get These Tests  Blood Pressure- Have your blood pressure checked once a year by your health care provider.  Normal blood pressure is 120/80.  Weight- Have your body mass index (BMI) calculated to screen for obesity.  BMI is measure of body fat based on height and weight.  You can also calculate your own BMI at https://www.west-esparza.com/www.nhlbisupport.com/bmi/.  Cholesterol- Have your cholesterol checked every 5 years starting at age 33 then yearly starting at age 10645.  Chlamydia, HIV, and other sexually transmitted diseases- Get screened every year until age 33, then within three months of each new sexual provider.  Pap Test -  Every 1-5 years; discuss with your health care provider.  Mammogram- Every 1-2 years starting at age 33--50  Take these medicines  Calcium with Vitamin D-Your body needs 1200 mg of Calcium each day and [phone] IU of Vitamin D daily.  Your body can only absorb 500 mg of Calcium at a time so Calcium must be taken in 2 or 3  divided doses throughout the day.  Multivitamin with folic acid- Once daily if it is possible for you to become pregnant.  Get these Immunizations  Gardasil-Series of three doses; prevents HPV related illness such as genital warts and cervical cancer.  Menactra-Single dose; prevents meningitis.  Tetanus shot- Every 10 years.  Flu shot-Every year.  Take these steps 1. Do not smoke-Your healthcare provider can help you quit.  For tips on how to quit go to www.smokefree.gov or call 1-800 QUITNOW. 2. Be physically active- Exercise 5 days a week for at least 30 minutes.  If you are not already physically active, start slow and gradually work up to 30 minutes of moderate physical activity.  Examples of moderate activity include walking briskly, dancing, swimming, bicycling, etc. 3. Breast Cancer- A self breast exam every month is important for early detection of breast cancer.  For more information and instruction on self breast exams, ask your healthcare provider or [place].eswww.womenshealth.gov/faq/breast-self-exam.cfm. 4. Eat a healthy diet- Eat a variety of healthy foods such as fruits, vegetables, whole grains, low fat milk, low fat cheeses, yogurt, lean meats, poultry and fish, beans, nuts, tofu, etc.  For more information go to www. Thenutritionsource.org 5. Drink alcohol in moderation- Limit alcohol intake to one drink or less per day. Never drink and drive. 6. Depression- Your emotional health is as important as your physical health.  If you're feeling down or losing interest in things you normally enjoy please talk to your healthcare provider about being screened for depression. 7. Dental visit- Brush and floss your teeth twice daily; visit your dentist twice a year. 8. Eye doctor- Get an eye exam at least every 2 years. 9. Helmet use- Always wear a helmet when riding a bicycle, motorcycle, rollerblading or skateboarding. 10. Safe sex- If you may be exposed to sexually transmitted infections, use  a condom. 11. Seat belts- Seat belts can save your live; always wear one. 12. Smoke/Carbon Monoxide detectors- These detectors need to be installed on the appropriate level of your home. Replace batteries at least once a year. 13. Skin cancer- When out in the sun please cover up and use sunscreen 15 SPF or higher. 14. Violence- If anyone is threatening or hurting you, please tell your healthcare provider.
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
## 2016-04-05 ENCOUNTER — Telehealth: Payer: Self-pay | Admitting: Emergency Medicine

## 2016-04-05 LAB — PAP IG, CT-NG, RFX HPV ASCU
Chlamydia Probe Amp: NOT DETECTED
GC Probe Amp: NOT DETECTED

## 2016-04-05 LAB — RPR

## 2016-04-05 LAB — HIV ANTIBODY (ROUTINE TESTING W REFLEX): HIV: NONREACTIVE

## 2016-04-05 NOTE — Telephone Encounter (Signed)
-----   Message from Garnetta BuddyStephanie D English, [place] sent at 04/05/2016  9:38 AM EDT ----- Please alert that hiv and syphilis was negative.

## 2016-04-05 NOTE — Telephone Encounter (Signed)
Pt given negative test results 

## 2016-04-07 LAB — URINE CULTURE

## 2017-01-22 ENCOUNTER — Emergency Department (HOSPITAL_COMMUNITY)
Admission: EM | Admit: 2017-01-22 | Discharge: 2017-01-22 | Disposition: A | Discharge status: Home / Self Care | Payer: Medicaid Other | Attending: Dermatology | Admitting: Dermatology

## 2017-01-22 ENCOUNTER — Encounter (HOSPITAL_COMMUNITY): Payer: Self-pay | Admitting: Emergency Medicine

## 2017-01-22 DIAGNOSIS — Z5321 Procedure and treatment not carried out due to patient leaving prior to being seen by health care provider: Secondary | ICD-10-CM | POA: Insufficient documentation

## 2017-01-22 DIAGNOSIS — R42 Dizziness and giddiness: Secondary | ICD-10-CM | POA: Insufficient documentation

## 2017-01-22 NOTE — ED Triage Notes (Signed)
Patient c/o dizziness x 4 days that has been constant. Patient states worse when changing positions.

## 2017-05-22 ENCOUNTER — Ambulatory Visit (HOSPITAL_COMMUNITY)
Admission: EM | Admit: 2017-05-22 | Discharge: 2017-05-22 | Disposition: A | Discharge status: Home / Self Care | Payer: Medicaid Other | Attending: Family Medicine | Admitting: Family Medicine

## 2017-05-22 ENCOUNTER — Encounter (HOSPITAL_COMMUNITY): Payer: Self-pay | Admitting: Emergency Medicine

## 2017-05-22 DIAGNOSIS — G44219 Episodic tension-type headache, not intractable: Secondary | ICD-10-CM | POA: Diagnosis not present

## 2017-05-22 DIAGNOSIS — B9689 Other specified bacterial agents as the cause of diseases classified elsewhere: Secondary | ICD-10-CM | POA: Diagnosis not present

## 2017-05-22 DIAGNOSIS — Z113 Encounter for screening for infections with a predominantly sexual mode of transmission: Secondary | ICD-10-CM | POA: Diagnosis present

## 2017-05-22 DIAGNOSIS — A5901 Trichomonal vulvovaginitis: Secondary | ICD-10-CM | POA: Diagnosis not present

## 2017-05-22 MED ORDER — BUTALBITAL-APAP-CAFFEINE 50-325-40 MG PO TABS: 1.0000 | ORAL_TABLET | Freq: Four times a day (QID) | ORAL | 0 refills | Status: AC | PRN

## 2017-05-22 NOTE — ED Notes (Signed)
Dirty urine collected. 

## 2017-05-22 NOTE — ED Triage Notes (Signed)
[facility] a [facility] of a headache x 1 year on and off. The patient also requested STD testing however reports no symptoms.

## 2017-05-22 NOTE — ED Provider Notes (Signed)
  [facility]   [id number] 05/22/17 Arrival Time: [id number]  ASSESSMENT & PLAN:  1. Episodic tension-type headache, not intractable     Meds ordered this encounter  Medications  . butalbital-acetaminophen-caffeine (FIORICET, ESGIC) 50-325-40 MG tablet    Sig: Take 1-2 tablets by mouth every 6 (six) hours as needed for headache.    Dispense:  20 tablet    Refill:  0    Order Specific Question:   Supervising Provider    Answer:   Mardella LaymanHAGLER, BRIAN [[id number]]    Reviewed expectations re: course of current medical issues. Questions answered. Outlined signs and symptoms indicating need for more acute intervention. Patient verbalized understanding. After Visit Summary given.   SUBJECTIVE:  Grace Woods is a 36 y.o. female who presents with complaint of headache that is bilateral frontal and she wants to be tested for STD but denies any sx's.  ROS: As per HPI.   OBJECTIVE:  Vitals:   05/22/17 1928  BP: 118/85  Pulse: 82  Resp: 18  Temp: 99 F (37.2 C)  TempSrc: Oral  SpO2: 99%     General appearance: alert; no distress HEENT: normocephalic; atraumatic; conjunctivae normal;Lungs: clear to auscultation bilaterally Heart: regular rate and rhythm Abdomen: soft, non-tender; bowel sounds normal; no masses or organomegaly; no guarding or rebound tenderness Back: no CVA tenderness Extremities: no cyanosis or edema; symmetrical with no gross deformities Skin: warm and dry Neurologic: normal symmetric reflexes; normal gait Psychological:  alert and cooperative; normal mood and affect    Labs Reviewed  URINE CYTOLOGY ANCILLARY ONLY    No results found.  No Known Allergies  PMHx, SurgHx, SocialHx, Medications, and Allergies were reviewed in the Visit Navigator and updated as appropriate.      [name], [name], OregonFNP 05/22/17 1944

## 2017-05-24 ENCOUNTER — Telehealth (HOSPITAL_COMMUNITY): Payer: Self-pay | Admitting: Internal Medicine

## 2017-05-24 LAB — URINE CYTOLOGY ANCILLARY ONLY
Chlamydia: NEGATIVE
Neisseria Gonorrhea: NEGATIVE
Trichomonas: POSITIVE — AB

## 2017-05-24 MED ORDER — METRONIDAZOLE 500 MG PO TABS: 2000.0000 mg | ORAL_TABLET | Freq: Once | ORAL | 0 refills | Status: AC

## 2017-05-24 NOTE — Telephone Encounter (Signed)
Clinical staff please let patient know that test for trichomonas was positive.  Rx metronidazole was sent to the pharmacy of record, [facility] [address] [facility].  Please refrain from sexual intercourse for 7 days to give the medicine time to work.  Sexual partners need to be notified and tested/treated.  Condoms may reduce risk of reinfection.  Recheck or followup with PCP for further evaluation if symptoms are not improving.   LM

## 2019-04-30 ENCOUNTER — Encounter (HOSPITAL_COMMUNITY): Payer: Self-pay

## 2019-04-30 ENCOUNTER — Emergency Department (HOSPITAL_COMMUNITY): Payer: Medicaid Other

## 2019-04-30 ENCOUNTER — Other Ambulatory Visit: Payer: Self-pay

## 2019-04-30 ENCOUNTER — Emergency Department (HOSPITAL_COMMUNITY)
Admission: EM | Admit: 2019-04-30 | Discharge: 2019-04-30 | Disposition: A | Discharge status: Home / Self Care | Payer: Medicaid Other | Attending: Emergency Medicine | Admitting: Emergency Medicine

## 2019-04-30 DIAGNOSIS — R0789 Other chest pain: Secondary | ICD-10-CM | POA: Diagnosis present

## 2019-04-30 LAB — BASIC METABOLIC PANEL
Anion gap: 8 (ref 5–15)
BUN: 10 mg/dL (ref 6–20)
CO2: 25 mmol/L (ref 22–32)
Calcium: 9.1 mg/dL (ref 8.9–10.3)
Chloride: 104 mmol/L (ref 98–111)
Creatinine, Ser: 0.73 mg/dL (ref 0.44–1.00)
GFR calc Af Amer: >60 mL/min (ref >60)
GFR calc non Af Amer: >60 mL/min (ref >60)
Glucose, Bld: 88 mg/dL (ref 70–99)
Potassium: 3.7 mmol/L (ref 3.5–5.1)
Sodium: 137 mmol/L (ref 135–145)

## 2019-04-30 LAB — CBC
HCT: 40.8 % (ref 36.0–46.0)
Hemoglobin: 13.3 g/dL (ref 12.0–15.0)
MCH: 30.4 pg (ref 26.0–34.0)
MCHC: 32.6 g/dL (ref 30.0–36.0)
MCV: 93.4 fL (ref 80.0–100.0)
Platelets: 262 10*3/uL (ref 150–400)
RBC: 4.37 MIL/uL (ref 3.87–5.11)
RDW: 13 % (ref 11.5–15.5)
WBC: 4.1 10*3/uL (ref 4.0–10.5)
nRBC: 0 % (ref 0.0–0.2)

## 2019-04-30 LAB — D-DIMER, QUANTITATIVE: D-Dimer, Quant: 0.38 ug/mL-FEU (ref 0.00–0.50)

## 2019-04-30 LAB — I-STAT BETA HCG BLOOD, ED (NOT ORDERABLE): I-stat hCG, quantitative: <5 m[IU]/mL (ref <5)

## 2019-04-30 LAB — TROPONIN I (HIGH SENSITIVITY): Troponin I (High Sensitivity): <2 ng/L (ref <18)

## 2019-04-30 MED ORDER — KETOROLAC TROMETHAMINE 30 MG/ML IJ SOLN
30.0000 mg | Freq: Once | INTRAMUSCULAR | Status: AC
  Administered 2019-04-30: 14:00:00 30 mg via INTRAMUSCULAR
  Filled 2019-04-30: qty 1

## 2019-04-30 MED ORDER — CELECOXIB 200 MG PO CAPS: 200.0000 mg | ORAL_CAPSULE | Freq: Two times a day (BID) | ORAL | 0 refills | Status: AC

## 2019-04-30 MED ORDER — SODIUM CHLORIDE 0.9% FLUSH: 3.0000 mL | Freq: Once | INTRAVENOUS | Status: DC

## 2019-04-30 NOTE — ED Provider Notes (Signed)
[facility]-EMERGENCY DEPT Provider Note   CSN: [id number] Arrival date & time: 04/30/19  1215    History   Chief Complaint Chief Complaint  Patient presents with  . Chest Pain    HPI Grace Woods is a 38 y.o. female.     Pt presents to the ED today with cp in the center of her chest.  She said it feels like a pressure.  It is worse with deep breaths, coughs, and movement.  No sob.  No f/c.  She has not been around anyone known to be Covid+. Pt's bp is on the low side, but this is nl for her.     Past Medical History:  Diagnosis Date  . Migraine     There are no active problems to display for this patient.   Past Surgical History:  Procedure Laterality Date  . TUBAL LIGATION       OB History   No obstetric history on file.      Home Medications    Prior to Admission medications   Medication Sig Start Date End Date Taking? Authorizing Provider  celecoxib (CELEBREX) 200 MG capsule Take 1 capsule (200 mg total) by mouth 2 (two) times daily. 04/30/19   Jacalyn LefevreHaviland, Meiling Hendriks, MD    Family History Family History  Problem Relation Age of Onset  . Hypertension Maternal Grandmother   . Cancer Maternal Grandmother 5570       stomach??  . Cancer Paternal Grandfather 6390       prostate    Social History Social History   Tobacco Use  . Smoking status: Never Smoker  . Smokeless tobacco: Never Used  Substance Use Topics  . Alcohol use: No  . Drug use: No     Allergies   Patient has no known allergies.   Review of Systems Review of Systems  Cardiovascular: Positive for chest pain.  All other systems reviewed and are negative.    Physical Exam Updated Vital Signs BP 104/69   Pulse 71   Temp 99 F (37.2 C)   Resp 16   Ht 5\' 2"  (1.575 m)   Wt 59 kg   LMP 04/09/2019   SpO2 100%   BMI 23.78 kg/m   Physical Exam Vitals signs and nursing note reviewed.  Constitutional:      Appearance: She is well-developed.  HENT:   Head: Normocephalic and atraumatic.  Eyes:     Extraocular Movements: Extraocular movements intact.     Pupils: Pupils are equal, round, and reactive to light.  Neck:     Musculoskeletal: Normal range of motion and neck supple.  Cardiovascular:     Rate and Rhythm: Normal rate and regular rhythm.     Heart sounds: Normal heart sounds.  Pulmonary:     Effort: Pulmonary effort is normal.     Breath sounds: Normal breath sounds.  Chest:    Abdominal:     General: Bowel sounds are normal.     Palpations: Abdomen is soft.  Musculoskeletal: Normal range of motion.  Skin:    General: Skin is warm.     Capillary Refill: Capillary refill takes less than 2 seconds.  Neurological:     General: No focal deficit present.     Mental Status: She is alert and oriented to person, place, and time.  Psychiatric:        Mood and Affect: Mood normal.        Behavior: Behavior normal.  ED Treatments / Results  Labs (all labs ordered are listed, but only abnormal results are displayed) Labs Reviewed  BASIC METABOLIC PANEL  CBC  D-DIMER, QUANTITATIVE (NOT AT [facility])  I-STAT BETA HCG BLOOD, ED ([facility], [facility], [facility] ONLY)  I-STAT BETA HCG BLOOD, ED (NOT ORDERABLE)  TROPONIN I (HIGH SENSITIVITY)    EKG EKG Interpretation  Date/Time:  Tuesday April 30 2019 12:29:33 EDT Ventricular Rate:  82 PR Interval:    QRS Duration: 81 QT Interval:  339 QTC Calculation: 396 R Axis:   77 Text Interpretation:  Sinus rhythm Borderline short PR interval No significant change since last tracing Confirmed by Dorie Rank [phone]) on 04/30/2019 12:35:11 PM   Radiology Dg Chest 2 View  Result Date: 04/30/2019 CLINICAL DATA:  Central chest pain for the past 2 weeks. EXAM: CHEST - 2 VIEW COMPARISON:  None. FINDINGS: The heart size and mediastinal contours are within normal limits. Both lungs are clear. The visualized skeletal structures are unremarkable. IMPRESSION: Normal examination. Electronically Signed   By: Claudie Revering M.D.   On: 04/30/2019 13:17    Procedures Procedures (including critical care time)  Medications Ordered in ED Medications  ketorolac (TORADOL) 30 MG/ML injection 30 mg (30 mg Intramuscular Given 04/30/19 1416)     Initial Impression / Assessment and Plan / ED Course  I have reviewed the triage vital signs and the nursing notes.  Pertinent labs & imaging results that were available during my care of the patient were reviewed by me and considered in my medical decision making (see chart for details).    Pain is MSK in nature.  Neg trop, ekg, CXR, and D-dimer.  She does not want to be checked for Covid-19.  She is feeling better after toradol.  Return if worse.  Final Clinical Impressions(s) / ED Diagnoses   Final diagnoses:  Atypical chest pain    ED Discharge Orders         Ordered    celecoxib (CELEBREX) 200 MG capsule  2 times daily     04/30/19 1442           Isla Pence, MD 04/30/19 1443

## 2019-04-30 NOTE — ED Triage Notes (Signed)
Pt c/o chest pain in the center of her chest x 2 weeks. Pt denies any cough, SHOB.

## 2019-12-28 IMAGING — CR CHEST - 2 VIEW
2 series · 2 of 2 positions shown · non-contrast
Comparison: None.

CLINICAL DATA: Central chest pain for the past 2 weeks.

EXAM:
CHEST - 2 VIEW

[w chest pa]
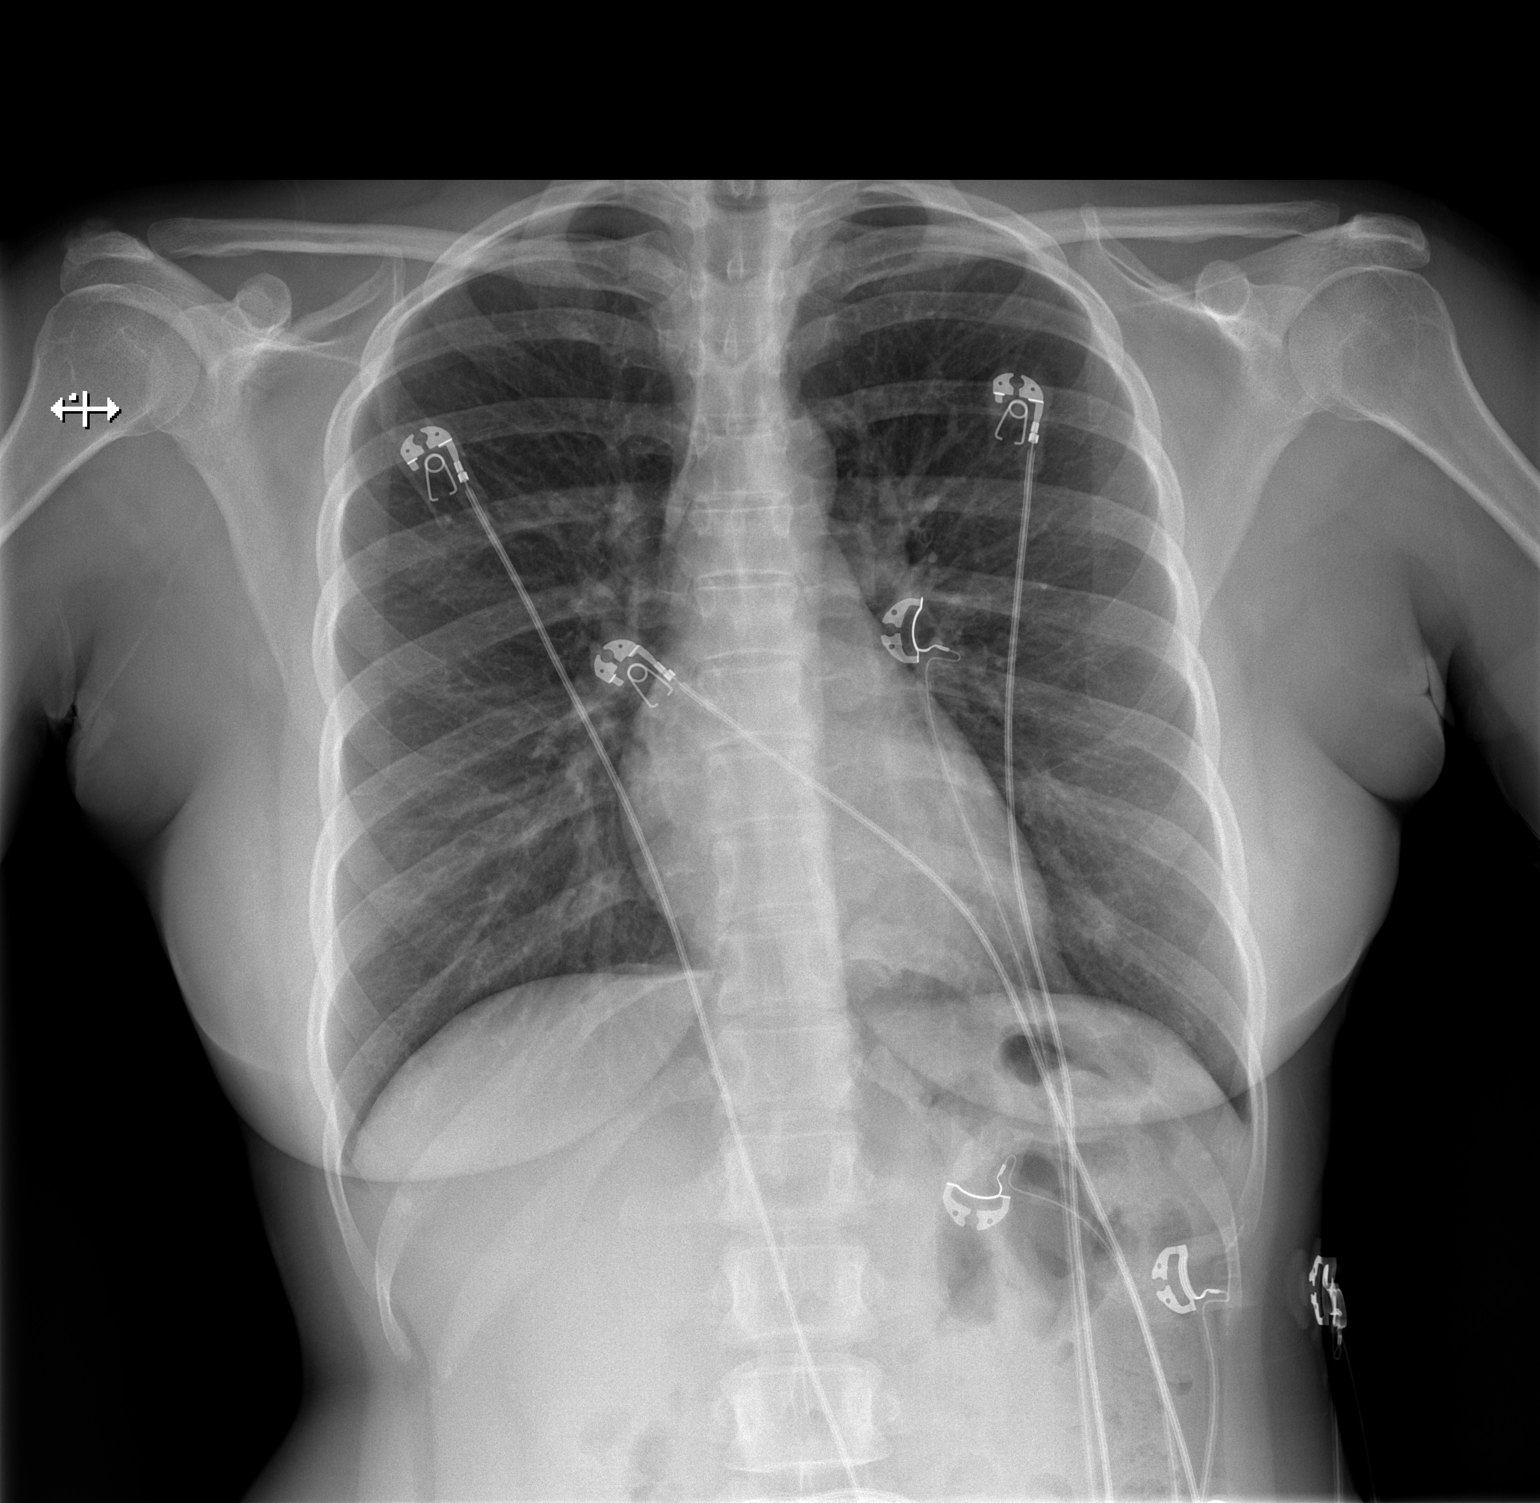

[w chest lat]
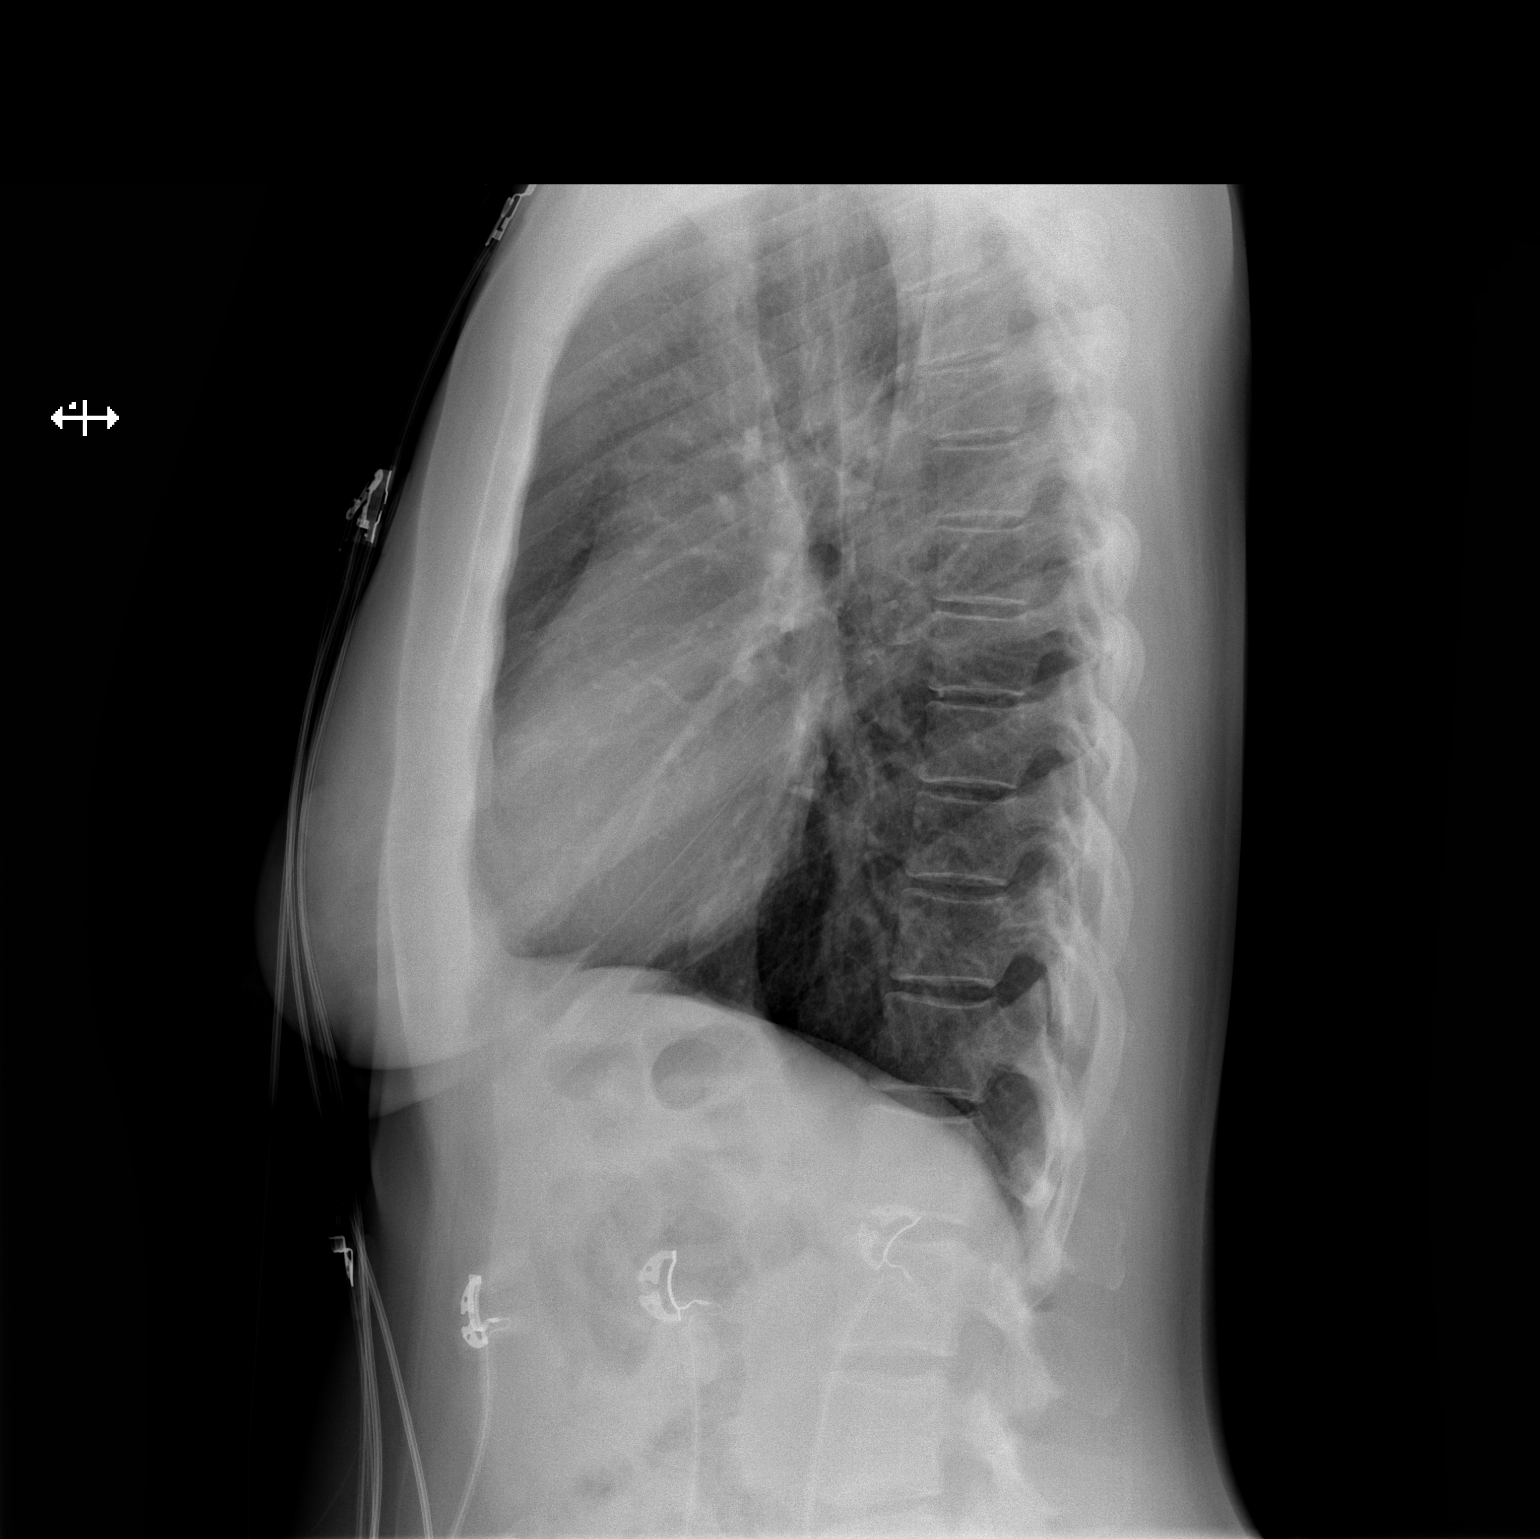

[2 of 2 positions shown; findings below may reference images not displayed]

FINDINGS: The heart size and mediastinal contours are within normal limits.
Both lungs are clear. The visualized skeletal structures are
unremarkable.
IMPRESSION: Normal examination.

## 2020-09-10 ENCOUNTER — Ambulatory Visit (INDEPENDENT_AMBULATORY_CARE_PROVIDER_SITE_OTHER): Payer: Medicaid Other

## 2020-09-10 ENCOUNTER — Other Ambulatory Visit: Payer: Self-pay

## 2020-09-10 ENCOUNTER — Encounter (HOSPITAL_COMMUNITY): Payer: Self-pay | Admitting: Emergency Medicine

## 2020-09-10 ENCOUNTER — Ambulatory Visit (HOSPITAL_COMMUNITY)
Admission: EM | Admit: 2020-09-10 | Discharge: 2020-09-10 | Disposition: A | Discharge status: Home / Self Care | Payer: Medicaid Other | Attending: Emergency Medicine | Admitting: Emergency Medicine

## 2020-09-10 DIAGNOSIS — R0789 Other chest pain: Secondary | ICD-10-CM | POA: Diagnosis not present

## 2020-09-10 DIAGNOSIS — S299XXA Unspecified injury of thorax, initial encounter: Secondary | ICD-10-CM | POA: Diagnosis not present

## 2020-09-10 DIAGNOSIS — M79661 Pain in right lower leg: Secondary | ICD-10-CM

## 2020-09-10 MED ORDER — MELOXICAM 7.5 MG PO TABS: 7.5000 mg | ORAL_TABLET | Freq: Two times a day (BID) | ORAL | 1 refills | Status: AC

## 2020-09-10 MED ORDER — METHOCARBAMOL 500 MG PO TABS: 500.0000 mg | ORAL_TABLET | Freq: Three times a day (TID) | ORAL | 0 refills | Status: AC | PRN

## 2020-09-10 MED ORDER — KETOROLAC TROMETHAMINE 30 MG/ML IJ SOLN: 30.0000 mg | Freq: Once | INTRAMUSCULAR | Status: DC

## 2020-09-10 MED ORDER — MELOXICAM 7.5 MG PO TABS: 7.5000 mg | ORAL_TABLET | Freq: Two times a day (BID) | ORAL | 1 refills | Status: DC

## 2020-09-10 NOTE — Discharge Instructions (Addendum)
Take meloxicam and Robaxin as directed. 
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
## 2020-09-13 ENCOUNTER — Emergency Department (HOSPITAL_COMMUNITY)
Admission: EM | Admit: 2020-09-13 | Discharge: 2020-09-13 | Disposition: A | Discharge status: Home / Self Care | Payer: Medicaid Other | Attending: Emergency Medicine | Admitting: Emergency Medicine

## 2020-09-13 ENCOUNTER — Other Ambulatory Visit: Payer: Self-pay

## 2020-09-13 DIAGNOSIS — S20219A Contusion of unspecified front wall of thorax, initial encounter: Secondary | ICD-10-CM | POA: Diagnosis not present

## 2020-09-13 DIAGNOSIS — S20219D Contusion of unspecified front wall of thorax, subsequent encounter: Secondary | ICD-10-CM

## 2020-09-13 DIAGNOSIS — S8991XA Unspecified injury of right lower leg, initial encounter: Secondary | ICD-10-CM | POA: Diagnosis present

## 2020-09-13 DIAGNOSIS — S8011XA Contusion of right lower leg, initial encounter: Secondary | ICD-10-CM | POA: Diagnosis not present

## 2020-09-13 MED ORDER — IBUPROFEN 800 MG PO TABS: 800.0000 mg | ORAL_TABLET | Freq: Three times a day (TID) | ORAL | 0 refills | Status: AC | PRN

## 2020-09-13 MED ORDER — KETOROLAC TROMETHAMINE 60 MG/2ML IM SOLN
60.0000 mg | Freq: Once | INTRAMUSCULAR | Status: AC
  Administered 2020-09-13: 60 mg via INTRAMUSCULAR
  Filled 2020-09-13: qty 2

## 2020-09-13 NOTE — Discharge Instructions (Signed)
Return if any problems.
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
## 2021-06-03 ENCOUNTER — Other Ambulatory Visit: Payer: Self-pay

## 2021-06-03 ENCOUNTER — Other Ambulatory Visit (HOSPITAL_COMMUNITY)
Admission: RE | Admit: 2021-06-03 | Discharge: 2021-06-03 | Disposition: A | Discharge status: Home / Self Care | Payer: Medicaid Other | Source: Ambulatory Visit | Attending: Obstetrics | Admitting: Obstetrics

## 2021-06-03 ENCOUNTER — Encounter: Payer: Self-pay | Admitting: Obstetrics

## 2021-06-03 ENCOUNTER — Ambulatory Visit: Payer: Medicaid Other | Admitting: Obstetrics

## 2021-06-03 VITALS — BP 118/76 | HR 97 | Ht 62.0 in | Wt 166.4 lb

## 2021-06-03 DIAGNOSIS — N971 Female infertility of tubal origin: Secondary | ICD-10-CM

## 2021-06-03 DIAGNOSIS — M5020 Other cervical disc displacement, unspecified cervical region: Secondary | ICD-10-CM | POA: Insufficient documentation

## 2021-06-03 DIAGNOSIS — M5126 Other intervertebral disc displacement, lumbar region: Secondary | ICD-10-CM | POA: Insufficient documentation

## 2021-06-03 DIAGNOSIS — Z3169 Encounter for other general counseling and advice on procreation: Secondary | ICD-10-CM

## 2021-06-03 DIAGNOSIS — S63501A Unspecified sprain of right wrist, initial encounter: Secondary | ICD-10-CM | POA: Insufficient documentation

## 2021-06-03 DIAGNOSIS — Z01419 Encounter for gynecological examination (general) (routine) without abnormal findings: Secondary | ICD-10-CM | POA: Insufficient documentation

## 2021-06-03 MED ORDER — VITAFOL ULTRA 29-0.6-0.4-200 MG PO CAPS: 1.0000 | ORAL_CAPSULE | Freq: Every day | ORAL | 4 refills | Status: AC
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
## 2021-06-03 NOTE — Progress Notes (Signed)
Subjective:        Grace Woods is a 40 y.o. female here for a routine exam.  Current complaints: None.    Personal health questionnaire:  Is patient Ashkenazi Jewish, have a family history of breast and/or ovarian cancer: no Is there a family history of uterine cancer diagnosed at age < 63, gastrointestinal cancer, urinary tract cancer, family member who is a Personnel officer syndrome-associated carrier: no Is the patient overweight and hypertensive, family history of diabetes, personal history of gestational diabetes, preeclampsia or PCOS: no Is patient over 53, have PCOS,  family history of premature CHD under age 72, diabetes, smoke, have hypertension or peripheral artery disease:  no At any time, has a partner hit, kicked or otherwise hurt or frightened you?: no Over the past 2 weeks, have you felt down, depressed or hopeless?: no Over the past 2 weeks, have you felt little interest or pleasure in doing things?:no   Gynecologic History Patient's last menstrual period was 05/29/2021 (exact date). Contraception: tubal ligation Last Pap: 2017. Results were: normal Last mammogram: n/a. Results were: n/a  Obstetric History OB History  Gravida Para Term Preterm AB Living  2 2 2  0 0 2  SAB IAB Ectopic Multiple Live Births  0 0 0 0 2    # Outcome Date GA Lbr Len/2nd Weight Sex Delivery Anes PTL Lv  2 Term 02/14/07    M    LIV  1 Term 12/05/03    F    LIV    Past Medical History:  Diagnosis Date   Migraine     Past Surgical History:  Procedure Laterality Date   TUBAL LIGATION       Current Outpatient Medications:    celecoxib (CELEBREX) 200 MG capsule, Take 1 capsule (200 mg total) by mouth 2 (two) times daily., Disp: 30 capsule, Rfl: 0   ibuprofen (ADVIL) 800 MG tablet, Take 1 tablet (800 mg total) by mouth every 8 (eight) hours as needed., Disp: 30 tablet, Rfl: 0 No Known Allergies  Social History   Tobacco Use   Smoking status: Never   Smokeless tobacco: Never   Substance Use Topics   Alcohol use: No    Family History  Problem Relation Age of Onset   Cancer Paternal Grandfather 84       prostate   Hypertension Maternal Grandmother    Cancer Maternal Grandmother 17       stomach??      Review of Systems  Constitutional: negative for fatigue and weight loss Respiratory: negative for cough and wheezing Cardiovascular: negative for chest pain, fatigue and palpitations Gastrointestinal: negative for abdominal pain and change in bowel habits Musculoskeletal:negative for myalgias Neurological: negative for gait problems and tremors Behavioral/Psych: negative for abusive relationship, depression Endocrine: negative for temperature intolerance    Genitourinary:negative for abnormal menstrual periods, genital lesions, hot flashes, sexual problems and vaginal discharge Integument/breast: negative for breast lump, breast tenderness, nipple discharge and skin lesion(s)    Objective:       BP 118/76   Pulse 97   Ht 5\' 2"  (1.575 m)   Wt 166 lb 6.4 oz (75.5 kg)   LMP 05/29/2021 (Exact Date)   BMI 30.43 kg/m  General:   Alert and no distress  Skin:   no rash or abnormalities  Lungs:   clear to auscultation bilaterally  Heart:   regular rate and rhythm, S1, S2 normal, no murmur, click, rub or gallop  Breasts:   normal without suspicious  masses, skin or nipple changes or axillary nodes  Abdomen:  normal findings: no organomegaly, soft, non-tender and no hernia  Pelvis:  External genitalia: normal general appearance Urinary system: urethral meatus normal and bladder without fullness, nontender Vaginal: normal without tenderness, induration or masses Cervix: normal appearance Adnexa: normal bimanual exam Uterus: anteverted and non-tender, normal size   Lab Review Urine pregnancy test Labs reviewed yes Radiologic studies reviewed no  I have spent a total of 20 minutes of face-to-face time, excluding clinical staff time, reviewing notes and  preparing to see patient, ordering tests and/or medications, and counseling the patient.   Assessment:    1. 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 Encounter for preconception consultation Rx: - Prenat-Fe Poly-Methfol-FA-DHA (VITAFOL ULTRA) 29-0.6-0.4-200 MG CAPS; Take 1 capsule by mouth daily before breakfast.  Dispense: 90 capsule; Refill: 4     Plan:    Education reviewed: calcium supplements, depression evaluation, low fat, low cholesterol diet, safe sex/STD prevention, self breast exams, and weight bearing exercise. Follow up in: 1 year.     Brock Bad, MD 06/03/2021 8:38 AM

## 2021-06-04 LAB — CYTOLOGY - PAP
Comment: NEGATIVE
Diagnosis: NEGATIVE
High risk HPV: NEGATIVE

## 2021-06-08 ENCOUNTER — Telehealth: Payer: Self-pay

## 2021-06-08 DIAGNOSIS — Z3169 Encounter for other general counseling and advice on procreation: Secondary | ICD-10-CM

## 2021-06-08 NOTE — Telephone Encounter (Signed)
Referral placed in chart  

## 2022-01-21 ENCOUNTER — Emergency Department (HOSPITAL_BASED_OUTPATIENT_CLINIC_OR_DEPARTMENT_OTHER)
Admission: EM | Admit: 2022-01-21 | Discharge: 2022-01-21 | Disposition: A | Discharge status: Home / Self Care | Payer: Medicaid Other | Attending: Emergency Medicine | Admitting: Emergency Medicine

## 2022-01-21 ENCOUNTER — Encounter (HOSPITAL_BASED_OUTPATIENT_CLINIC_OR_DEPARTMENT_OTHER): Payer: Self-pay | Admitting: Emergency Medicine

## 2022-01-21 ENCOUNTER — Other Ambulatory Visit: Payer: Self-pay

## 2022-01-21 DIAGNOSIS — R1032 Left lower quadrant pain: Secondary | ICD-10-CM | POA: Insufficient documentation

## 2022-01-21 DIAGNOSIS — N10 Acute tubulo-interstitial nephritis: Secondary | ICD-10-CM

## 2022-01-21 LAB — BASIC METABOLIC PANEL
Anion gap: 9 (ref 5–15)
BUN: 15 mg/dL (ref 6–20)
CO2: 28 mmol/L (ref 22–32)
Calcium: 9.6 mg/dL (ref 8.9–10.3)
Chloride: 104 mmol/L (ref 98–111)
Creatinine, Ser: 1.19 mg/dL — ABNORMAL HIGH (ref 0.44–1.00)
GFR, Estimated: >60 mL/min (ref >60)
Glucose, Bld: 106 mg/dL — ABNORMAL HIGH (ref 70–99)
Potassium: 3.9 mmol/L (ref 3.5–5.1)
Sodium: 141 mmol/L (ref 135–145)

## 2022-01-21 LAB — URINALYSIS, ROUTINE W REFLEX MICROSCOPIC
Bilirubin Urine: NEGATIVE
Glucose, UA: NEGATIVE mg/dL
Ketones, ur: NEGATIVE mg/dL
Nitrite: POSITIVE — AB
Protein, ur: 30 mg/dL — AB
RBC / HPF: >50 RBC/hpf — ABNORMAL HIGH (ref 0–5)
Specific Gravity, Urine: 1.022 (ref 1.005–1.030)
WBC, UA: >50 WBC/hpf — ABNORMAL HIGH (ref 0–5)
pH: 6.5 (ref 5.0–8.0)

## 2022-01-21 LAB — CBC
HCT: 36 % (ref 36.0–46.0)
Hemoglobin: 11.7 g/dL — ABNORMAL LOW (ref 12.0–15.0)
MCH: 28.4 pg (ref 26.0–34.0)
MCHC: 32.5 g/dL (ref 30.0–36.0)
MCV: 87.4 fL (ref 80.0–100.0)
Platelets: 312 10*3/uL (ref 150–400)
RBC: 4.12 MIL/uL (ref 3.87–5.11)
RDW: 13.4 % (ref 11.5–15.5)
WBC: 6.6 10*3/uL (ref 4.0–10.5)
nRBC: 0 % (ref 0.0–0.2)

## 2022-01-21 LAB — PREGNANCY, URINE: Preg Test, Ur: NEGATIVE

## 2022-01-21 MED ORDER — CEPHALEXIN 500 MG PO CAPS: 500.0000 mg | ORAL_CAPSULE | Freq: Three times a day (TID) | ORAL | 0 refills | Status: AC

## 2022-01-21 MED ORDER — KETOROLAC TROMETHAMINE 30 MG/ML IJ SOLN
30.0000 mg | Freq: Once | INTRAMUSCULAR | Status: AC
  Administered 2022-01-21: 30 mg via INTRAVENOUS
  Filled 2022-01-21: qty 1

## 2022-01-21 MED ORDER — OXYCODONE-ACETAMINOPHEN 5-325 MG PO TABS
1.0000 | ORAL_TABLET | ORAL | Status: DC | PRN
  Filled 2022-01-21: qty 1

## 2022-01-21 MED ORDER — SODIUM CHLORIDE 0.9 % IV SOLN
1.0000 g | Freq: Once | INTRAVENOUS | Status: AC
  Administered 2022-01-21: 1 g via INTRAVENOUS
  Filled 2022-01-21: qty 10

## 2022-01-21 MED ORDER — IBUPROFEN 400 MG PO TABS
400.0000 mg | ORAL_TABLET | Freq: Once | ORAL | Status: AC | PRN
  Administered 2022-01-21: 400 mg via ORAL
  Filled 2022-01-21: qty 1

## 2022-01-21 NOTE — Discharge Instructions (Signed)
Begin taking Keflex as prescribed. ? ?Return to the emergency department if you develop worsening pain, high fevers, or other new and concerning symptoms. ?

## 2022-01-21 NOTE — ED Provider Notes (Signed)
?[facility] ?Provider Note ? ? ?CSN: [id number] ?Arrival date & time: 01/21/22  0054 ? ?  ? ?History ? ?Chief Complaint  ?Patient presents with  ? Flank Pain  ? ? ?Grace Woods is a 41 y.o. female. ? ?Patient is a 41 year old female with no significant past medical history.  Patient presenting today with complaints of left flank and left lower abdominal pain.  This has been worsening over the past 2 days.  She denies any fevers or chills.  She denies burning with urination.  She has had UTIs in the past that have felt somewhat different than this. ? ?The history is provided by the patient.  ?Flank Pain ?This is a new problem. The current episode started 2 days ago. The problem occurs constantly. The problem has been gradually worsening. Nothing aggravates the symptoms. Nothing relieves the symptoms. She has tried nothing for the symptoms.  ? ?  ? ?Home Medications ?Prior to Admission medications   ?Medication Sig Start Date End Date Taking? Authorizing Provider  ?celecoxib (CELEBREX) 200 MG capsule Take 1 capsule (200 mg total) by mouth 2 (two) times daily. 04/30/19   Jacalyn Lefevre, MD  ?ibuprofen (ADVIL) 800 MG tablet Take 1 tablet (800 mg total) by mouth every 8 (eight) hours as needed. 09/13/20   Elson Areas, PA-C  ?Prenat-Fe Poly-Methfol-FA-DHA (VITAFOL ULTRA) 29-0.6-0.4-200 MG CAPS Take 1 capsule by mouth daily before breakfast. 06/03/21   Brock Bad, MD  ?   ? ?Allergies    ?Patient has no known allergies.   ? ?Review of Systems   ?Review of Systems  ?Genitourinary:  Positive for flank pain.  ?All other systems reviewed and are negative. ? ?Physical Exam ?Updated Vital Signs ?BP 124/83   Pulse (!) 107   Temp 98.2 ?F (36.8 ?C) (Oral)   Resp 16   Ht 5\' 2"  (1.575 m)   Wt 72.6 kg   LMP 01/13/2022 (Approximate)   SpO2 99%   BMI 29.26 kg/m?  ?Physical Exam ?Vitals and nursing note reviewed.  ?Constitutional:   ?   General: She is not in acute distress. ?    Appearance: She is well-developed. She is not diaphoretic.  ?HENT:  ?   Head: Normocephalic and atraumatic.  ?Cardiovascular:  ?   Rate and Rhythm: Normal rate and regular rhythm.  ?   Heart sounds: No murmur heard. ?  No friction rub. No gallop.  ?Pulmonary:  ?   Effort: Pulmonary effort is normal. No respiratory distress.  ?   Breath sounds: Normal breath sounds. No wheezing.  ?Abdominal:  ?   General: Bowel sounds are normal. There is no distension.  ?   Palpations: Abdomen is soft.  ?   Tenderness: There is abdominal tenderness. There is left CVA tenderness. There is no right CVA tenderness.  ?   Comments: There is mild left CVA tenderness and left lower quadrant tenderness.  ?Musculoskeletal:     ?   General: Normal range of motion.  ?   Cervical back: Normal range of motion and neck supple.  ?Skin: ?   General: Skin is warm and dry.  ?Neurological:  ?   General: No focal deficit present.  ?   Mental Status: She is alert and oriented to person, place, and time.  ? ? ?ED Results / Procedures / Treatments   ?Labs ?(all labs ordered are listed, but only abnormal results are displayed) ?Labs Reviewed  ?URINALYSIS, ROUTINE W REFLEX MICROSCOPIC - Abnormal; Notable  for the following components:  ?    Result Value  ? APPearance HAZY (*)   ? Hgb urine dipstick LARGE (*)   ? Protein, ur 30 (*)   ? Nitrite POSITIVE (*)   ? Leukocytes,Ua MODERATE (*)   ? RBC / HPF >50 (*)   ? WBC, UA >50 (*)   ? Bacteria, UA MANY (*)   ? All other components within normal limits  ?CBC - Abnormal; Notable for the following components:  ? Hemoglobin 11.7 (*)   ? All other components within normal limits  ?BASIC METABOLIC PANEL - Abnormal; Notable for the following components:  ? Glucose, Bld 106 (*)   ? Creatinine, Ser 1.19 (*)   ? All other components within normal limits  ?PREGNANCY, URINE  ? ? ?EKG ?None ? ?Radiology ?No results found. ? ?Procedures ?Procedures  ? ? ?Medications Ordered in ED ?Medications  ?oxyCODONE-acetaminophen  (PERCOCET/ROXICET) 5-325 MG per tablet 1 tablet (1 tablet Oral Not Given 01/21/22 0115)  ?ketorolac (TORADOL) 30 MG/ML injection 30 mg (has no administration in time range)  ?cefTRIAXone (ROCEPHIN) 1 g in sodium chloride 0.9 % 100 mL IVPB (has no administration in time range)  ?ibuprofen (ADVIL) tablet 400 mg (400 mg Oral Given 01/21/22 0119)  ? ? ?ED Course/ Medical Decision Making/ A&P ? ?Patient presenting with left flank pain and urinalysis most consistent with a UTI.  He is comfortable appearing and I doubt a kidney stone.  She has no fever and no white count and I feel can safely be discharged with Keflex and as needed return. ? ?Final Clinical Impression(s) / ED Diagnoses ?Final diagnoses:  ?None  ? ? ?Rx / DC Orders ?ED Discharge Orders   ? ? None  ? ?  ? ? ?  ?Geoffery Lyons, MD ?01/21/22 [phone] ? ?

## 2022-01-21 NOTE — ED Triage Notes (Signed)
Presents for L flank since yesterday morning.  ?Denies N/V/D, urinary sx, fever, chills. Last BM today.  ? ?Has tried advil at home for pain, improved pain temporarily but has returned 9/10. ? ?Denies h/o kidney stones. H/o tubal ligation.  ?

## 2022-01-21 NOTE — ED Notes (Signed)
Pt agreeable with d/c plan as discussed by provider- this nurse has verbally reinforced d/c instructions and provided pt with written copy - pt acknowledges verbal understanding and denies any additional questions, concerns, needs- pt ambulatory at discharge independently with steady gait; vitals stable - no distress.   ?
